# Patient Record
Sex: Female | Born: 1990 | Hispanic: Yes | Marital: Single | Smoking: Never smoker
Health system: Southern US, Community
[De-identification: ages and names within clinical notes are randomized; demographics above are authoritative.]

## PROBLEM LIST (undated history)

## (undated) DIAGNOSIS — J45909 Unspecified asthma, uncomplicated: Secondary | ICD-10-CM

## (undated) DIAGNOSIS — O24419 Gestational diabetes mellitus in pregnancy, unspecified control: Secondary | ICD-10-CM

## (undated) HISTORY — DX: Morbid (severe) obesity due to excess calories: E66.01

## (undated) HISTORY — PX: CHOLECYSTECTOMY: SHX55

---

## 2013-01-21 LAB — HEPATITIS B SURFACE ANTIGEN W/ REFLEX TO CONFIRMATION: Hepatitis B Surface Antigen: NEGATIVE

## 2013-01-21 LAB — RPR: RPR: NONREACTIVE

## 2013-01-21 LAB — GONOCOCCUS CULTURE
Chlamydia trachomatis Culture: NEGATIVE
Culture Gonorrhoeae: NEGATIVE

## 2013-01-21 LAB — RUBELLA ANTIBODY, IGG: Rubella AB, IgG: IMMUNE

## 2013-01-21 LAB — HIV AG/AB 4TH GENERATION: HIV Ag/Ab, 4th Generation: NEGATIVE

## 2013-03-21 LAB — GROUP B STREP TRANSCRIBED: GBS Transcribed: NEGATIVE

## 2013-03-31 ENCOUNTER — Inpatient Hospital Stay: Payer: Medicaid Other | Admitting: Obstetrics & Gynecology

## 2013-03-31 ENCOUNTER — Inpatient Hospital Stay
Admission: AD | Admit: 2013-03-31 | Discharge: 2013-04-04 | DRG: 540 | Disposition: A | Discharge status: Home / Self Care | Payer: No Typology Code available for payment source | Source: Ambulatory Visit | Attending: Obstetrics & Gynecology | Admitting: Obstetrics & Gynecology

## 2013-03-31 DIAGNOSIS — O368990 Maternal care for other specified fetal problems, unspecified trimester, not applicable or unspecified: Secondary | ICD-10-CM | POA: Diagnosis present

## 2013-03-31 DIAGNOSIS — O99892 Other specified diseases and conditions complicating childbirth: Principal | ICD-10-CM | POA: Diagnosis present

## 2013-03-31 DIAGNOSIS — Z3A37 37 weeks gestation of pregnancy: Secondary | ICD-10-CM

## 2013-03-31 DIAGNOSIS — O43899 Other placental disorders, unspecified trimester: Secondary | ICD-10-CM | POA: Diagnosis present

## 2013-03-31 DIAGNOSIS — O329XX0 Maternal care for malpresentation of fetus, unspecified, not applicable or unspecified: Secondary | ICD-10-CM | POA: Diagnosis present

## 2013-03-31 DIAGNOSIS — J45909 Unspecified asthma, uncomplicated: Secondary | ICD-10-CM | POA: Diagnosis present

## 2013-03-31 HISTORY — DX: Unspecified asthma, uncomplicated: J45.909

## 2013-03-31 LAB — CBC AND DIFFERENTIAL
Basophils Absolute Automated: 0.02 (ref 0.00–0.20)
Basophils Automated: 0 %
Eosinophils Absolute Automated: 0.05 (ref 0.00–0.70)
Eosinophils Automated: 0 %
Hematocrit: 33.7 % — ABNORMAL LOW (ref 37.0–47.0)
Hgb: 11.3 g/dL — ABNORMAL LOW (ref 12.0–16.0)
Immature Granulocytes Absolute: 0.03
Immature Granulocytes: 0 %
Lymphocytes Absolute Automated: 3.65 (ref 0.50–4.40)
Lymphocytes Automated: 27 %
MCH: 28.3 pg (ref 28.0–32.0)
MCHC: 33.5 g/dL (ref 32.0–36.0)
MCV: 84.5 fL (ref 80.0–100.0)
MPV: 11.9 fL (ref 9.4–12.3)
Monocytes Absolute Automated: 0.65 (ref 0.00–1.20)
Monocytes: 5 %
Neutrophils Absolute: 8.95 — ABNORMAL HIGH (ref 1.80–8.10)
Neutrophils: 67 %
Nucleated RBC: 0 (ref 0–1)
Platelets: 194 (ref 140–400)
RBC: 3.99 — ABNORMAL LOW (ref 4.20–5.40)
RDW: 14 % (ref 12–15)
WBC: 13.32 — ABNORMAL HIGH (ref 3.50–10.80)

## 2013-03-31 LAB — TYPE AND SCREEN
AB Screen Gel: NEGATIVE
ABO Rh: O POS

## 2013-03-31 MED ORDER — OXYTOCIN 30 UNITS IN LR 500 ML LABOR -OUTSOURCED
2.0000 m[IU]/min | INTRAVENOUS | Status: DC
Start: 2013-03-31 — End: 2013-04-01
  Administered 2013-04-01: 7.5 m[IU]/min via INTRAVENOUS
  Administered 2013-04-01: 2 m[IU]/min via INTRAVENOUS
  Filled 2013-03-31: qty 500

## 2013-03-31 MED ORDER — LACTATED RINGERS IV SOLN
INTRAVENOUS | Status: DC
Start: 2013-03-31 — End: 2013-04-01
  Administered 2013-04-01 (×3): 1000 mL via INTRAVENOUS

## 2013-03-31 MED ORDER — ONDANSETRON HCL 4 MG/2ML IJ SOLN
8.0000 mg | Freq: Three times a day (TID) | INTRAMUSCULAR | Status: DC | PRN
Start: 2013-03-31 — End: 2013-04-01

## 2013-03-31 NOTE — H&P (Signed)
Subjective:   Dana Norman is a 22 y.o. G1 P0 female with [facility] 04/18/13 at 77 and 0/[redacted] weeks gestation who is being admitted for labor management.  Her current obstetrical history is significant for being uncomplicated according to pt.  EMR not available from office..  Patient reports contractions since this morning, mild and bloody show..   Fetal Movement: normal.   Past Medical History   Diagnosis Date   . Asthma without status asthmaticus      albuterol inhaler   .        Objective:     Vital signs in last 24 hours:  Heart Rate:  [94] 94   BP: (124)/(66) 124/66 mmHg      General:   alert, appears stated age, cooperative and no distress   Lungs:   clear to auscultation bilaterally   Heart:   regular rate and rhythm, S1, S2 normal, no murmur, click, rub or gallop   Abdomen:  soft, non-tender; bowel sounds normal; no masses,  no organomegaly and EFW c/w 7-8 lbs   Pelvis:  Exam deferred.   FHT:  145 Category I   Presentations: cephalic   Cervix:     Dilation: 3cm    Effacement: 75%    Station:  -1    Consistency: soft    Position: posterior     Lab Review labs unknown due to lack of prenatal record.  Pt states GBS negative     Assessment/Plan:   38 and 0/[redacted] weeks gestation.  Early latent labor.  Obstetrical history significant for nothing .       Risks, benefits, alternatives and possible complications have been discussed in detail with the patient.  Pre-admission, admission, and post admission procedures and expectations were discussed in detail.  All questions answered, all appropriate consents will be signed at the Hospital. Admission is planned for today.   Ambulate, unmonitored., Expectant management. and Anticipate vaginal delivery.

## 2013-03-31 NOTE — Progress Notes (Signed)
Pt to LDR 2. Report to Maurine Cane, RN.

## 2013-03-31 NOTE — Progress Notes (Signed)
22yo g1p0 at 37.3 weeks to triage states last night she had a "glob of bloody mucous" and this morning also had some spots of pink tinged discharge. Pt reports mild ctx and denies leaking fluid. Positive fetal movement. EFM and toco applied.

## 2013-03-31 NOTE — Progress Notes (Signed)
Pt off monitors to walk.

## 2013-04-01 ENCOUNTER — Inpatient Hospital Stay: Payer: No Typology Code available for payment source | Admitting: Certified Registered"

## 2013-04-01 ENCOUNTER — Encounter: Payer: Self-pay | Admitting: Certified Registered"

## 2013-04-01 ENCOUNTER — Encounter
Admission: AD | Disposition: A | Discharge status: Home / Self Care | Payer: Self-pay | Source: Ambulatory Visit | Attending: Obstetrics & Gynecology

## 2013-04-01 DIAGNOSIS — Z3A37 37 weeks gestation of pregnancy: Secondary | ICD-10-CM

## 2013-04-01 SURGERY — Surgical Case
Anesthesia: Regional | Site: Abdomen | Wound class: Clean Contaminated

## 2013-04-01 MED ORDER — LACTATED RINGERS IV BOLUS
1000.0000 mL | Freq: Once | INTRAVENOUS | Status: DC
Start: 2013-04-01 — End: 2013-04-01

## 2013-04-01 MED ORDER — NALOXONE HCL 0.4 MG/ML IJ SOLN
0.1000 mg | INTRAMUSCULAR | Status: DC | PRN
Start: 2013-04-01 — End: 2013-04-04

## 2013-04-01 MED ORDER — MAGNESIUM HYDROXIDE 400 MG/5ML PO SUSP
30.0000 mL | Freq: Four times a day (QID) | ORAL | Status: DC | PRN
Start: 2013-04-01 — End: 2013-04-04

## 2013-04-01 MED ORDER — LANOLIN EX OINT
TOPICAL_OINTMENT | CUTANEOUS | Status: DC | PRN
Start: 2013-04-01 — End: 2013-04-04

## 2013-04-01 MED ORDER — OXYCODONE-ACETAMINOPHEN 5-325 MG PO TABS
2.0000 | ORAL_TABLET | ORAL | Status: DC | PRN
Start: 2013-04-01 — End: 2013-04-04
  Administered 2013-04-03: 1 via ORAL
  Administered 2013-04-03: 2 via ORAL
  Administered 2013-04-03 (×3): 1 via ORAL
  Administered 2013-04-04: 2 via ORAL
  Filled 2013-04-01: qty 1
  Filled 2013-04-01: qty 2
  Filled 2013-04-01: qty 1
  Filled 2013-04-01: qty 2
  Filled 2013-04-01: qty 1
  Filled 2013-04-01: qty 2

## 2013-04-01 MED ORDER — MORPHINE SULFATE (PF) 1 MG/ML IJ SOLN
INTRAMUSCULAR | Status: DC | PRN
Start: 2013-04-01 — End: 2013-04-01
  Administered 2013-04-01: 3 mg via EPIDURAL

## 2013-04-01 MED ORDER — BENZOCAINE-MENTHOL 20-0.5 % EX AERO
1.0000 | INHALATION_SPRAY | CUTANEOUS | Status: DC | PRN
Start: 2013-04-01 — End: 2013-04-04

## 2013-04-01 MED ORDER — METOCLOPRAMIDE HCL 5 MG/ML IJ SOLN
10.0000 mg | Freq: Once | INTRAMUSCULAR | Status: DC | PRN
Start: 2013-04-01 — End: 2013-04-01

## 2013-04-01 MED ORDER — HYDROMORPHONE HCL PF 1 MG/ML IJ SOLN
0.2000 mg | INTRAMUSCULAR | Status: DC | PRN
Start: 2013-04-01 — End: 2013-04-04

## 2013-04-01 MED ORDER — ONDANSETRON HCL 4 MG/2ML IJ SOLN
4.0000 mg | Freq: Once | INTRAMUSCULAR | Status: DC | PRN
Start: 2013-04-01 — End: 2013-04-01

## 2013-04-01 MED ORDER — MEPERIDINE HCL 25 MG/ML IJ SOLN
12.5000 mg | INTRAMUSCULAR | Status: DC | PRN
Start: 2013-04-01 — End: 2013-04-01

## 2013-04-01 MED ORDER — SODIUM CHLORIDE 0.9 % IJ SOLN
INTRAMUSCULAR | Status: AC
Start: 2013-04-01 — End: ?
  Filled 2013-04-01: qty 10

## 2013-04-01 MED ORDER — METOCLOPRAMIDE HCL 5 MG/ML IJ SOLN
INTRAMUSCULAR | Status: AC
Start: 2013-04-01 — End: ?
  Filled 2013-04-01: qty 2

## 2013-04-01 MED ORDER — MORPHINE SULFATE (PF) 1 MG/ML IJ SOLN
INTRAMUSCULAR | Status: AC
Start: 2013-04-01 — End: ?
  Filled 2013-04-01: qty 10

## 2013-04-01 MED ORDER — SIMETHICONE 80 MG PO CHEW
160.0000 mg | CHEWABLE_TABLET | Freq: Three times a day (TID) | ORAL | Status: DC | PRN
Start: 2013-04-01 — End: 2013-04-04

## 2013-04-01 MED ORDER — LACTATED RINGERS IV SOLN
INTRAVENOUS | Status: DC | PRN
Start: 2013-04-01 — End: 2013-04-01

## 2013-04-01 MED ORDER — ONDANSETRON HCL 4 MG PO TABS
4.0000 mg | ORAL_TABLET | Freq: Three times a day (TID) | ORAL | Status: DC | PRN
Start: 2013-04-01 — End: 2013-04-04

## 2013-04-01 MED ORDER — OXYTOCIN 30UNITS IN LR 500 ML PP--OUTSOURCED
7.5000 [IU]/h | INTRAVENOUS | Status: AC
Start: 2013-04-01 — End: 2013-04-02

## 2013-04-01 MED ORDER — NALBUPHINE HCL 10 MG/ML IJ SOLN
10.0000 mg | INTRAMUSCULAR | Status: DC | PRN
Start: 2013-04-01 — End: 2013-04-01

## 2013-04-01 MED ORDER — FENTANYL CITRATE 0.05 MG/ML IJ SOLN
INTRAMUSCULAR | Status: DC | PRN
Start: 2013-04-01 — End: 2013-04-01
  Administered 2013-04-01: 100 ug via EPIDURAL

## 2013-04-01 MED ORDER — HYDROMORPHONE HCL PF 1 MG/ML IJ SOLN
0.5000 mg | INTRAMUSCULAR | Status: DC | PRN
Start: 2013-04-01 — End: 2013-04-01

## 2013-04-01 MED ORDER — WITCH HAZEL-GLYCERIN EX PADS
1.0000 | MEDICATED_PAD | CUTANEOUS | Status: DC | PRN
Start: 2013-04-01 — End: 2013-04-04

## 2013-04-01 MED ORDER — ONDANSETRON HCL 4 MG/2ML IJ SOLN
4.0000 mg | Freq: Once | INTRAMUSCULAR | Status: DC | PRN
Start: 2013-04-01 — End: 2013-04-04

## 2013-04-01 MED ORDER — CEFAZOLIN SODIUM 1 G IJ SOLR
1.0000 g | Freq: Once | INTRAMUSCULAR | Status: DC
Start: 2013-04-01 — End: 2013-04-01

## 2013-04-01 MED ORDER — METHYLERGONOVINE MALEATE 0.2 MG/ML IJ SOLN
INTRAMUSCULAR | Status: AC
Start: 2013-04-01 — End: 2013-04-02
  Filled 2013-04-01: qty 1

## 2013-04-01 MED ORDER — CARBOPROST TROMETHAMINE 250 MCG/ML IM SOLN
250.0000 ug | Freq: Once | INTRAMUSCULAR | Status: DC | PRN
Start: 2013-04-01 — End: 2013-04-01

## 2013-04-01 MED ORDER — KETOROLAC TROMETHAMINE 30 MG/ML IJ SOLN
INTRAMUSCULAR | Status: AC
Start: 2013-04-01 — End: ?
  Filled 2013-04-01: qty 1

## 2013-04-01 MED ORDER — NALBUPHINE HCL 10 MG/ML IJ SOLN
10.0000 mg | INTRAMUSCULAR | Status: DC | PRN
Start: 2013-04-01 — End: 2013-04-04

## 2013-04-01 MED ORDER — ONDANSETRON HCL 4 MG/2ML IJ SOLN
INTRAMUSCULAR | Status: DC | PRN
Start: 2013-04-01 — End: 2013-04-01
  Administered 2013-04-01: 4 mg via INTRAVENOUS

## 2013-04-01 MED ORDER — HYDROCORTISONE 1 % EX OINT
TOPICAL_OINTMENT | Freq: Three times a day (TID) | CUTANEOUS | Status: DC | PRN
Start: 2013-04-01 — End: 2013-04-04

## 2013-04-01 MED ORDER — SENNOSIDES-DOCUSATE SODIUM 8.6-50 MG PO TABS
1.0000 | ORAL_TABLET | Freq: Two times a day (BID) | ORAL | Status: DC | PRN
Start: 2013-04-01 — End: 2013-04-04
  Administered 2013-04-03: 1 via ORAL
  Filled 2013-04-01: qty 1

## 2013-04-01 MED ORDER — CEFAZOLIN SODIUM-DEXTROSE 2-3 GM-% IV SOLR
2.0000 g | Freq: Once | INTRAVENOUS | Status: DC
Start: 2013-04-01 — End: 2013-04-01

## 2013-04-01 MED ORDER — CITRIC ACID-SODIUM CITRATE 334-500 MG/5ML PO SOLN
30.0000 mL | Freq: Once | ORAL | Status: DC | PRN
Start: 2013-04-01 — End: 2013-04-01

## 2013-04-01 MED ORDER — LACTATED RINGERS IV SOLN
INTRAVENOUS | Status: DC
Start: 2013-04-01 — End: 2013-04-01

## 2013-04-01 MED ORDER — PHENYLEPHRINE 100 MCG/ML IV BOLUS (ANESTHESIA)
PREFILLED_SYRINGE | INTRAVENOUS | Status: DC | PRN
Start: 2013-04-01 — End: 2013-04-01
  Administered 2013-04-01 (×4): 200 ug via INTRAVENOUS

## 2013-04-01 MED ORDER — PRENATAL AD PO TABS
1.0000 | ORAL_TABLET | Freq: Every day | ORAL | Status: DC
Start: 2013-04-01 — End: 2013-04-04
  Administered 2013-04-03 – 2013-04-04 (×2): 1 via ORAL
  Filled 2013-04-01 (×2): qty 1

## 2013-04-01 MED ORDER — MISOPROSTOL 200 MCG PO TABS
800.0000 ug | ORAL_TABLET | Freq: Once | ORAL | Status: DC | PRN
Start: 2013-04-01 — End: 2013-04-01

## 2013-04-01 MED ORDER — OXYCODONE-ACETAMINOPHEN 5-325 MG PO TABS
2.0000 | ORAL_TABLET | ORAL | Status: DC | PRN
Start: 2013-04-01 — End: 2013-04-04

## 2013-04-01 MED ORDER — DIPHENHYDRAMINE HCL 50 MG/ML IJ SOLN
6.2500 mg | INTRAMUSCULAR | Status: DC | PRN
Start: 2013-04-01 — End: 2013-04-04

## 2013-04-01 MED ORDER — ONDANSETRON HCL 4 MG/2ML IJ SOLN
4.0000 mg | Freq: Three times a day (TID) | INTRAMUSCULAR | Status: DC | PRN
Start: 2013-04-01 — End: 2013-04-04

## 2013-04-01 MED ORDER — CEFAZOLIN SODIUM-DEXTROSE 2-3 GM-% IV SOLR
INTRAVENOUS | Status: AC
Start: 2013-04-01 — End: 2013-04-02
  Filled 2013-04-01: qty 50

## 2013-04-01 MED ORDER — METHYLERGONOVINE MALEATE 0.2 MG PO TABS
0.2000 mg | ORAL_TABLET | Freq: Four times a day (QID) | ORAL | Status: AC | PRN
Start: 2013-04-01 — End: 2013-04-02

## 2013-04-01 MED ORDER — DEXAMETHASONE SODIUM PHOSPHATE 4 MG/ML IJ SOLN (WRAP)
INTRAMUSCULAR | Status: DC | PRN
Start: 2013-04-01 — End: 2013-04-01
  Administered 2013-04-01: 4 mg via INTRAVENOUS

## 2013-04-01 MED ORDER — BISACODYL 10 MG RE SUPP
10.0000 mg | Freq: Every day | RECTAL | Status: DC | PRN
Start: 2013-04-01 — End: 2013-04-04

## 2013-04-01 MED ORDER — MEASLES, MUMPS & RUBELLA VAC SC INJ
0.5000 mL | INJECTION | SUBCUTANEOUS | Status: DC | PRN
Start: 2013-04-01 — End: 2013-04-04

## 2013-04-01 MED ORDER — FENTANYL 2 MCG/ML+BUPIVACAINE 0.125% 100 ML
EPIDURAL | Status: DC | PRN
Start: 2013-04-01 — End: 2013-04-01
  Administered 2013-04-01: 10 mL/h via EPIDURAL

## 2013-04-01 MED ORDER — METHYLERGONOVINE MALEATE 0.2 MG/ML IJ SOLN
200.0000 ug | Freq: Once | INTRAMUSCULAR | Status: DC | PRN
Start: 2013-04-01 — End: 2013-04-01

## 2013-04-01 MED ORDER — PROMETHAZINE HCL 25 MG/ML IJ SOLN
6.2500 mg | Freq: Once | INTRAMUSCULAR | Status: DC | PRN
Start: 2013-04-01 — End: 2013-04-01

## 2013-04-01 MED ORDER — BUPIVACAINE HCL (PF) 0.25 % IJ SOLN
INTRAMUSCULAR | Status: DC | PRN
Start: 2013-04-01 — End: 2013-04-01
  Administered 2013-04-01: 4 mL via EPIDURAL
  Administered 2013-04-01: 3 mL via EPIDURAL

## 2013-04-01 MED ORDER — KETOROLAC TROMETHAMINE 30 MG/ML IJ SOLN
INTRAMUSCULAR | Status: DC | PRN
Start: 2013-04-01 — End: 2013-04-01
  Administered 2013-04-01: 30 mg via INTRAVENOUS

## 2013-04-01 MED ORDER — KETOROLAC TROMETHAMINE 30 MG/ML IJ SOLN
30.0000 mg | Freq: Four times a day (QID) | INTRAMUSCULAR | Status: DC | PRN
Start: 2013-04-01 — End: 2013-04-04
  Administered 2013-04-02 (×3): 30 mg via INTRAVENOUS
  Filled 2013-04-01 (×3): qty 1

## 2013-04-01 MED ORDER — METHYLERGONOVINE MALEATE 0.2 MG/ML IJ SOLN
INTRAMUSCULAR | Status: DC | PRN
Start: 2013-04-01 — End: 2013-04-01
  Administered 2013-04-01: .2 mg via INTRAMUSCULAR

## 2013-04-01 MED ORDER — FENTANYL CITRATE 0.05 MG/ML IJ SOLN
INTRAMUSCULAR | Status: AC
Start: 2013-04-01 — End: ?
  Filled 2013-04-01: qty 2

## 2013-04-01 MED ORDER — MISOPROSTOL 200 MCG PO TABS
800.0000 ug | ORAL_TABLET | Freq: Once | ORAL | Status: DC | PRN
Start: 2013-04-01 — End: 2013-04-04

## 2013-04-01 MED ORDER — FENTANYL 2 MCG/ML+BUPIVACAINE 0.125% 100 ML
10.0000 mL/h | EPIDURAL | Status: DC
Start: 2013-04-01 — End: 2013-04-01

## 2013-04-01 MED ORDER — PROMETHAZINE HCL 25 MG/ML IJ SOLN
6.2500 mg | INTRAMUSCULAR | Status: DC | PRN
Start: 2013-04-01 — End: 2013-04-04

## 2013-04-01 MED ORDER — LIDOCAINE-EPINEPHRINE 2 %-1:200000 IJ SOLN
INTRAMUSCULAR | Status: DC | PRN
Start: 2013-04-01 — End: 2013-04-01
  Administered 2013-04-01: 1 mg
  Administered 2013-04-01: 2 mg
  Administered 2013-04-01: 3 mg via EPIDURAL
  Administered 2013-04-01: 2 mg
  Administered 2013-04-01: 7 mg

## 2013-04-01 MED ORDER — OXYTOCIN 10 UNIT/ML IJ SOLN
INTRAMUSCULAR | Status: DC | PRN
Start: 2013-04-01 — End: 2013-04-01
  Administered 2013-04-01: 15 [IU] via INTRAVENOUS

## 2013-04-01 MED ORDER — IBUPROFEN 600 MG PO TABS
600.0000 mg | ORAL_TABLET | Freq: Four times a day (QID) | ORAL | Status: DC | PRN
Start: 2013-04-01 — End: 2013-04-04
  Administered 2013-04-03 – 2013-04-04 (×5): 600 mg via ORAL
  Filled 2013-04-01 (×6): qty 1

## 2013-04-01 MED ORDER — ONDANSETRON HCL 4 MG/2ML IJ SOLN
INTRAMUSCULAR | Status: AC
Start: 2013-04-01 — End: ?
  Filled 2013-04-01: qty 2

## 2013-04-01 MED ORDER — DEXAMETHASONE SODIUM PHOSPHATE 4 MG/ML IJ SOLN
INTRAMUSCULAR | Status: AC
Start: 2013-04-01 — End: ?
  Filled 2013-04-01: qty 1

## 2013-04-01 MED ORDER — FAMOTIDINE 10 MG/ML IV SOLN (WRAP)
20.0000 mg | Freq: Once | INTRAVENOUS | Status: DC | PRN
Start: 2013-04-01 — End: 2013-04-01

## 2013-04-01 MED ORDER — METOCLOPRAMIDE HCL 5 MG/ML IJ SOLN
INTRAMUSCULAR | Status: DC | PRN
Start: 2013-04-01 — End: 2013-04-01
  Administered 2013-04-01: 10 mg via INTRAVENOUS

## 2013-04-01 MED ORDER — FENTANYL CITRATE 0.05 MG/ML IJ SOLN
50.0000 ug | INTRAMUSCULAR | Status: DC | PRN
Start: 2013-04-01 — End: 2013-04-01

## 2013-04-01 MED ORDER — NALOXONE HCL 0.4 MG/ML IJ SOLN
0.1000 mg | INTRAMUSCULAR | Status: DC | PRN
Start: 2013-04-01 — End: 2013-04-01

## 2013-04-01 SURGICAL SUPPLY — 37 items
ADHESIVE TISSUE INDERMIL (Skin Closure) IMPLANT
BARD FOLEY CATH TRAY  W/SL 16F (Catheter Urine) ×2 IMPLANT
BLANKET LOWER BODY ALLIANCE (Patient Supply) ×2 IMPLANT
DRESSING ISLAND TELFA 4X10 (Dressing) ×2 IMPLANT
GLOVE SRG 6.5 BGL M LTX STRL PF TXTR (Glove) ×1
GLOVE SURGICAL 6 1/2 BIOGEL M POWDER (Glove) ×1
GLOVE SURGICAL 6 1/2 BIOGEL M POWDER FREE TEXTURE BEAD CUFF (Glove) ×1 IMPLANT
INSTRUMENT SUCTION NON VENTED LARGE EYED SHEATH POOLE 0035040 (Tubes) IMPLANT
MARKER SKIN (Positioning Supplies) IMPLANT
PROTECTOR TISS MED ALEXIS LF FLXB RTRCT (Retractor) ×1
PROTECTOR TISS XL ALEXIS LF FLXB RTRCT (Retractor)
PROTECTOR TISSUE MEDIUM FLEXBLE (Retractor) ×1
PROTECTOR TISSUE MEDIUM FLEXIBLE (Retractor) ×1
PROTECTOR TISSUE XL FLEXIBLE RETRACTION (Retractor)
PROTECTOR TISSUE XL FLEXIBLE RETRACTION RING ATRAUMATIC SELF RETAIN (Retractor) IMPLANT
RETRACTOR TISSUE MEDIUM FLEXIBLE RETRACTION RING ATRAUMATIC SELF (Retractor) ×1 IMPLANT
SLEEVE CMPR MED KN LGTH KDL SCD 21- IN (Sleeve) ×2
SLEEVE COMPRESSION MEDIUM KNEE LENGTH KENDALL SEQUENTIAL OD21- IN (Sleeve) ×1 IMPLANT
SOL NACL .9% 10ML VIAL FLIPTO (IV Solutions) IMPLANT
SPONGE LAP 18X18IN PREWASH WHT (Sponge)
SPONGE LAPAROTOMY L18 IN X W18 IN (Sponge)
SPONGE LAPAROTOMY L18 IN X W18 IN PREWASH WHITE (Sponge) IMPLANT
STRIP SKIN CLOSURE L4 IN X W1/2 IN (Dressing)
STRIP SKIN CLOSURE L4 IN X W1/2 IN REINFORCE STERI-STRIP POLYESTER (Dressing) IMPLANT
STRIP SKNCLS PLSTR STRSTRP 4X.5IN LF (Dressing)
SUTURE CHROMIC 1 CT 36IN (Suture) ×2 IMPLANT
SUTURE CHROMIC 2-0 CT1 27IN (Suture) ×4 IMPLANT
SUTURE PLAIN 3-0 CT1 27IN (Suture) ×2 IMPLANT
SUTURE VICRYL 0-0 CT1 (Suture) ×6 IMPLANT
SUTURE VICRYL 4-0 PS2 27IN (Suture) IMPLANT
SYRINGE CATH TIP 50ML LF STRL GRAD (Syringes, Needles)
SYRINGE CATHETER TIP GRADUATED (Syringes, Needles)
SYRINGE CATHETER TIP GRADUATED NONPYROGENIC DEHP FREE PVC FREE 50 ML (Syringes, Needles) IMPLANT
TRAY C-BIRTH (Pack) ×2 IMPLANT
TRAY CATH FOLEY 16F (Catheter Micellaneous)
TRAY CATH FOLEY 16F (Catheter Miscellaneous) IMPLANT
TUBING POOLE SUCTION DEVICE (Tubes)

## 2013-04-01 NOTE — Progress Notes (Signed)
Report off to Karen Peterson RN

## 2013-04-01 NOTE — Progress Notes (Signed)
Received report from Donzetta Kohut RN assumed care of pt.

## 2013-04-01 NOTE — Progress Notes (Signed)
UPDATE    Patient has now been pushing 45 minutes and has made no change in station.  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  We have tried lying patient on left lateral decubitus position without rotation.  Baby did not tolerate right lateral decubitus at all with prolonged deceleration.  I have counseled patient and will proceed with cesarean.

## 2013-04-01 NOTE — Progress Notes (Signed)
Off to OR via bed for primary C/S

## 2013-04-01 NOTE — Progress Notes (Signed)
Has had regular but mild contractions all night. Becoming stronger.  VE:  4-5 cms 75% effaced, -2 station but head not ballotable.  Attempted AROM but little fluid obtained. Will start PItocin if contractions don't pick up.

## 2013-04-01 NOTE — H&P (Signed)
Subjective:   Dana Norman is a 22 y.o. G1P0 female at [redacted]w[redacted]d, Estimated Date of Delivery: 04/18/13.  She was admitted at 5:30 PM yesterday for labor management.  Her current obstetrical history is significant for being uncomplicated.  She was managed expectantly overnight and pitocin was begun this morning when she was 4 cm.  Was 5 cm to my exam at 10:00 this morning with fetal head overriding maternal pelvis above pelvic inlet.    Historical Data:  Past Medical History   Diagnosis Date   . Asthma without status asthmaticus      albuterol inhaler     History reviewed. No pertinent past surgical history.  OB History   Gravida Para Term Preterm AB SAB TAB Ectopic Multiple Living   1               # Outcome Date GA Lbr Len/2nd Weight Sex Delivery Anes PTL Lv   1 CUR                   Meds:  Prescriptions prior to admission   Medication Sig Dispense Refill   . Prenatal MV-Min-Fe Fum-FA-DHA (PRENATAL 1 PO) Take by mouth.             All:  NKDA        Vital signs in last 24 hours:  Temp:  [97.8 F (36.6 C)-98.1 F (36.7 C)] 97.8 F (36.6 C)  Heart Rate:  [62-94] 80   Resp Rate:  [20] 20   BP: (0-135)/(0-82) 124/74 mmHg     FHT:  140's Category 1    General:   appears stated age, cooperative   Lungs:   Clear to auscultation bilaterally   Heart:    Regular rate and rhythm, no murmer, no rub, no gallop   Abdomen:   Soft, nonender, normal active bowel sounds, gravid uterus   SVE:  C/^/-2 AROM at 11:L30 this morning for clear fluid     Lab Review:  GBS negative  Lab Results   Component Value Date    ABORH O POS 03/31/2013        Assessment:   22 y.o. G1P0 female at [redacted]w[redacted]d   Active labor   Pitocin augmentation   Malpresentation   Has epidural   GBS negative    Plan:   Risks, benefits, alternatives and possible complications have been discussed in detail with the patient.  All questions answered.    Continues to labor with presentation improved.   Continue pitocin augmentation

## 2013-04-01 NOTE — Progress Notes (Signed)
MOM     22 y.o.  G1P1001  [redacted]w[redacted]d weeks  Admitted for labor  Prior Delivery none   Her current obstetrical history is significant for     No Known Allergies    Delivering Clinician:     Timoteo Ace ARNOLD   Delivery Type:     Cesarean     Anesthesia:     Epidural   On-Q Pump no    Past Medical History   Diagnosis Date   . Asthma without status asthmaticus      albuterol inhaler         Rupture time - 04/01/2013  1125  Fluid color - clear    Laceration (Yes/None):     None   Laceration Type:       Episiotomy:     None       Pertinent prenatal Labs -   Lab Results   Component Value Date    ABORH O POS 03/31/2013    HEPBSAG Negative 01/21/2013    GBS Negative 03/21/2013    RUBELLAABIGG Immune 01/21/2013       Fundus:1/u  Lochia:small    Vaccine Screening:  Tdap:         Wants    Given     Declined        Flu:             Wants    Given     Declined             Pneumo:    Wants    Given     Declined        Rhogam:    Wants    Given     Declined    MR:             Wants   Given     Declined        Summary of Events-      ADDITIONAL COMMENTS:    Scheduled Meds______________________________________    PRN Meds____________________________________________    Medications given in L&D-   Methergine 0.2 mg IM in OR, 1817 Toradol and Zofran     Bladder status- Foley    IV Infusing--LR and 2nd bag of Pit    EBL  1000    Classes:  Breast feedng Class        Discharge Class          BABY      Cesarean  of live female  infant 6 lb 5.6 oz (2880 g)      1 Minute Apgar:     8      5 Minute Apgar:     9     ADDITIONAL COMMENTS:  Skin-to-skin initiated, breast fed @ 1900       for  15 mins  Formula  none            Labs due-   Cord blood sent sent  Eyes & Thighs given at 1747    Void Occurrence- no  Stool Occurrence- no  Bath:  Hep B:    Blood Type __________  TCB ___________@___________     Does NOT desire circumcision    At home Pediatrician: [facility] Pediatrician:     H&P:  Hearing:   CCHD:   Circ:   CID:   PKU:   CSC:    *Pt  transferred to [facility]. Bedside report given to [facility] RN using ISHAPED report. Bands checked and verified at bedside  with [facility] personnel. Safety checks done. Pt oriented to [facility] room, all questions answered. Pt care relinquished.    Toula Moos  04/01/2013 9:15 PM

## 2013-04-01 NOTE — Anesthesia Preprocedure Evaluation (Addendum)
Anesthesia Evaluation    AIRWAY    Mallampati: III    TM distance: >3 FB  Neck ROM: full  Mouth Opening:full   CARDIOVASCULAR    cardiovascular exam normal       DENTAL    No notable dental hx     PULMONARY    pulmonary exam normal     OTHER FINDINGS    Risks and benefits and alternatives discussed with patient. Questions answered and patient demonstrates understanding and agrees with anesthetic plan.                   Anesthesia Plan    ASA 2     epidural                                 informed consent obtained      pertinent labs reviewed

## 2013-04-01 NOTE — Plan of Care (Signed)
Resumed pt care

## 2013-04-01 NOTE — Plan of Care (Signed)
Brooke Dare RN assummed pt care

## 2013-04-01 NOTE — Progress Notes (Signed)
Dr. Dareen Piano talking to pt and FOB about C/S since distress occurs with ev. Push without progress

## 2013-04-01 NOTE — Anesthesia Postprocedure Evaluation (Signed)
Pt is doing well.  No post partum anesthesia issues.  Pt is ambulating well. No post-dural puncture headache. No residual weakness.     Dana Geathers C Filipescu-Turner, MD

## 2013-04-01 NOTE — Plan of Care (Signed)
Problem: Pain/Discomfort  Goal: Pain/discomfort is manageable.  Patient states pain is 2/10 and she is comfortable with that. Pain management plan discussed with patient. Will continue to assess pain and provide pain management as needed.

## 2013-04-01 NOTE — Op Note (Signed)
Patient: Dana Norman, Dana Norman   MRN: [id number]  Document ID:   Procedure Date: 04/01/2013      Pre-operative Diagnosis: Pregnancy at 37 4/7 weeks, active labor, fetal intolerance of labor  Post-operative Diagnosis: same, delivered a live boy LOP malpresentation     Procedure: Primary Low Transverse Cesarean section    Anesthesia: Spinal     Surgeon: Daine Gip     Assistant: Elvina Sidle    Drains:  Foley    Estimated Blood Loss:  1000 cc    Fluids:  Crystalloid        Findings: Delivered a live boy infant from LOP  with asynclytism and compound right arm presentation.  Normal tubes and ovaries, placenta  Deep pelvis, pelvic exam consistent with android pelvis.     Infant Wt:      6 lb 5.6 oz (2880 g)      Apgars:  Apgar 1 minute: 8  Apgar 5 minute: 9           Placenta and Cord:          Mechanism: spontaneous with pitocin assist    Presentation:  Please see cesarean intention note.    Procedure:      The risks, benefits, possible complications, treatment options, and expected outcomes were discussed with the patient.  The patient asked appropriate questions which were answered to her satisfaction. The patient concurred with the proposed plan, giving informed consent, and she was taken to the operating room, where adequate spinal anesthesia was administered.  A foley catheter was placed, and the patient was prepped and draped in the dorsal supine position with leftward tilt in the usual sterile fashion. Intravenous antibiotics were administered, sequential compression devices were in place, and a surgical time out was performed per protocol.  A scalpel was used to make the skin incision and this incision was carried down to the level of the fascia, which was incised in the midline and extended bilaterally using Mayo scissors. Kocher clamps were used to grasp the inferior and superior borders of the fascial incision, which was dissected sharply off the rectus muscles with the Mayo scissors. The rectus  muscles were split in the midline. The peritoneum was entered bluntly superiorly. The peritoneum was then extended in a superior-inferior fashion, taking care to avoid the bladder. The Alexis retractor was placed in the abdominopelvic incision to assist in visualization. A bladder flap was then created with pickups and Metzenbaum scissors, and the bladder was mobilized inferiorly without difficulty.The uterine incision was extended bluntly in a cranio-caudal direction.  An amniotomy was performed and clear amniotic fluid was obtained, followed by delivery of the infant onto the maternal abdomen. The infant was bulb suctioned for clear amniotic fluid.  The cord was clamped and cut, and the infant was handed to waiting pediatric team.  The placenta was delivered spontaneously intact with pitocin assist. The uterine cavity was examined and cleaned with moist gauze for any remaining membrane fragments and noted to be normal.  The incision extended into the left uterine artery and this was ligated and made hemostatic. The uterus was then closed in a two layer closure with 0 Vicryl in a continuous interlocking fashion. The second layer was of a horizontal imbricating stitch.  Excellent hemomstasis was noted after several figure of 8 sutures were placed. The adnexa were examined with findings as mentioned above. The uterine incision was again examined and noted to be hemostatic. The Jon Gills was removed from the abdominopelvic incision. The  rectus muscles were reapproximated in the midline with imbricating sutures  of 2-0 chromic. The fascia was closed in a running unlocked fashionwith 0 Vicryl suture beginning at each angle and meeting in the midline. The subcutaneous tissue was reapproximated with interrupted suture of 3-0 plain and the skin was closed with a 4-0 Monocryl in a subcuticular fashion. All instrument, sponge, and needle counts were correct, the fundus was expressed, the bandage placed, and the patient and  infant were transferred to the recovery room in stable condition.     Timoteo Ace, M.D., F.A.C.O.G.  [phone]

## 2013-04-01 NOTE — Progress Notes (Signed)
Dr. Dareen Piano here to observe pushing,  Pt to left side [phone] to begin pushing on left side

## 2013-04-01 NOTE — Progress Notes (Signed)
This note also relates to the following rows which could not be included:  Heart Rate - Cannot attach notes to unvalidated device data  BP - Cannot attach notes to unvalidated device data      1125 am as the official time for AROM as per Dr Dareen Piano

## 2013-04-01 NOTE — Anesthesia Procedure Notes (Addendum)
Epidural    Patient location during procedure: L&D  Reason for block: maternal indication  Start time: 04/01/2013 10:50 AM  End time: 04/01/2013 11:05 AM  Staffing  Anesthesiologist: Wyona Almas  CRNA/Resident: MONTGOMERY, Terri Malerba RYAN  Performed by: resident/CRNA Preanesthetic Checklist Completed: patient identified, surgical consent, pre-op evaluation, timeout performed, risks and benefits discussed, monitors and equipment checked and correct site      Epidural    Patient monitoring: Pulse oximetry and NIBP  Premedication: No    Patient position: sitting  Sterile Technique: ChloraPrep  Skin Local: lidocaine 1%      Interspace: L3-4  Number of attempts: 2    Approach: midline  Needle type: Touhy needle     Needle gauge: 17    Epidural Space ID: 7 cm  CSF Return: No   Blood Return: No  Paresthesia Pain: no  Needle type: Touhy needle     Needle gauge: 17  CSF Return: No  Blood Return: No          Paresthesia Pain: no    Catheter type: side hole    Catheter size: 19 G    Catheter at skin depth: 16 cm  CSF Return: No  Blood Return: No  Test Dose:1.5 % lidocaine and negative3 cc  Incremental injection: yes  Pressure Paresthesia: no  No Catheter IV/SA Signs or SymptomsAssessment   Sensory level: Bilateral and T10  Block Outcome: successful block, patient tolerated procedure well and no complications  Additional Notes  First attempt unable to thread catheter.   Block at Surgeon's request: Yes

## 2013-04-02 LAB — CBC AND DIFFERENTIAL
Hematocrit: 30.1 % — ABNORMAL LOW (ref 37.0–47.0)
Hgb: 10 g/dL — ABNORMAL LOW (ref 12.0–16.0)
MCH: 28.2 pg (ref 28.0–32.0)
MCHC: 33.2 g/dL (ref 32.0–36.0)
MCV: 84.8 fL (ref 80.0–100.0)
MPV: 12.4 fL — ABNORMAL HIGH (ref 9.4–12.3)
Platelets: 180 (ref 140–400)
RBC: 3.55 — ABNORMAL LOW (ref 4.20–5.40)
RDW: 14 % (ref 12–15)
WBC: 22.55 — ABNORMAL HIGH (ref 3.50–10.80)

## 2013-04-02 LAB — MAN DIFF ONLY
Band Neutrophils Absolute: 2.71 — ABNORMAL HIGH (ref 0.00–1.00)
Band Neutrophils: 12 %
Basophils Absolute Manual: 0 (ref 0.00–0.20)
Basophils Manual: 0 %
Eosinophils Absolute Manual: 0 (ref 0.00–0.70)
Eosinophils Manual: 0 %
Lymphocytes Absolute Manual: 1.35 (ref 0.50–4.40)
Lymphocytes Manual: 6 %
Monocytes Absolute: 0.9 (ref 0.00–1.20)
Monocytes Manual: 4 %
Neutrophils Absolute Manual: 17.59 — ABNORMAL HIGH (ref 1.80–8.10)
Nucleated RBC: 0 (ref 0–1)
Segmented Neutrophils: 78 %

## 2013-04-02 LAB — CELL MORPHOLOGY
Cell Morphology: NORMAL
Platelet Estimate: NORMAL

## 2013-04-02 MED ORDER — LACTATED RINGERS IV SOLN
INTRAVENOUS | Status: DC
Start: 2013-04-02 — End: 2013-04-04

## 2013-04-02 NOTE — Progress Notes (Signed)
Chart reviewed.  Pt seen.   No apparent anesthestic complications.    Pt s/p single shot intrathecal narcotics.      Mental status: awake and oriented  Respiratory function: stable  No complaints. Pain well controlled.    No significant side effects except mild pruritus.     Pt tolerating PO.  Pt  transitioned to PO pain meds.

## 2013-04-02 NOTE — Plan of Care (Signed)
Problem: Cesarean and Vaginal Delivery-Postpartum Care  Goal: Incision/Perineum will be clean, dry, and intact and without discharge or hematoma.  Outcome: Progressing  Abd incision clean, dry and intact.

## 2013-04-02 NOTE — Progress Notes (Signed)
Room visit, first time mom, delivered less than 24 hrs. Mom states "BF well so far". Educated on BF basics and showed and reinforced BF info in mom's booklet. Enc to continue BF 8-12/day and BF class in am. Enc to call with next feeding for latch check. Phone number on white board.

## 2013-04-02 NOTE — Progress Notes (Signed)
Post Op / Postpartum Note POD #1    Subjective:  Delivery Type: Cesarean Section   No complaints. Pain well controlled. Minimal Bleeding,     Objective:  Vital Signs:  Temp:  [97.2 F (36.2 C)-98.6 F (37 C)] 98.1 F (36.7 C)  Heart Rate:  [55-91] 76   Resp Rate:  [18-21] 18   BP: (0-131)/(0-76) 107/54 mmHg  Breast: soft, non tender   Fundus: firm, non tender   Incision: clean, dry and intact   Lochia: moderate, rubra.  Lower Extremities: trace edema, NT    Recent Labs:    Lab 04/02/13 0530   WBC 22.55*   HGB 10.0*   HCT 30.1*   PLT 180   NEUTROPCT --   LYMPHOPCT --   MONOPCT --   EOSPCT --       Assessment/Plan:  Stable - Postpartum Day 1  Ambulate and advance diet    JTB

## 2013-04-03 ENCOUNTER — Encounter: Payer: Self-pay | Admitting: Obstetrics & Gynecology

## 2013-04-03 NOTE — Progress Notes (Signed)
04/03/13 1400   Lactation Consultation   Visit Type  Follow Up    Mom called, she watched breast feeding video, set up in football positioned,  Baby latched on left breast and dropping and rhythmic sucking with swallows, baby was skin to skin before latching, mom has very pliable, baby sucking well, went over breast compression, sandwich, getting baby on breast deep, check nipple after latch, encouraged breast feeding class in the morning, went over crying and pressure bubbles, no need for formula, continued to breast feed.

## 2013-04-03 NOTE — Plan of Care (Signed)
Problem: Cesarean and Vaginal Delivery-Postpartum Care  Goal: Vital signs and assessments are within defined parameters.  Outcome: Progressing  Assessment and vitals within normal limits

## 2013-04-03 NOTE — Plan of Care (Signed)
Problem: Cesarean and Vaginal Delivery-Postpartum Care  Goal: Ambulates independently.  Outcome: Progressing  Able to ambulate on her own without difficulty.

## 2013-04-03 NOTE — Progress Notes (Signed)
04/03/13 1230   Lactation Consultation   Visit Type  Initial Assessment   Maternal Information   Breastfeeding Status YES   Previous Breastfeeding Experience No   Breast Assessment   Left Breast Large;Round;Pliable   Left Nipple Evert;Short   Right Breast Large;Round;Pliable;Colostrum Present   Right Nipple Evert;Short    Mom feeding supplements and reports baby not latching catching nipple, mom gave some formula, +colostrum, reviewed skin to skin and the importance of baby cueing in, mom to call when baby ready, skin to skin now.

## 2013-04-03 NOTE — Plan of Care (Signed)
Problem: Pain/Discomfort  Goal: Pain/discomfort is manageable.  Be comfortable at all time.

## 2013-04-03 NOTE — Progress Notes (Signed)
Post Op / Postpartum Note    Subjective:  Delivery Type: Cesarean Section   No complaints. Pain well controlled. Minimal Bleeding, Breastfeeding well.    Objective:  Vital Signs:  Temp:  [97 F (36.1 C)-98 F (36.7 C)] 97.7 F (36.5 C)  Heart Rate:  [76-95] 95   Resp Rate:  [16-20] 16   BP: (103-114)/(53-63) 112/53 mmHg  Breast: soft, non tender   Fundus: firm, non tender   Incision: clean, dry and intact   Lochia: moderate, rubra.  Lower Extremities: 1+ bilateral edema, NT    Recent Labs:    Lab 04/02/13 0530   WBC 22.55*   HGB 10.0*   HCT 30.1*   PLT 180   NEUTROPCT --   LYMPHOPCT --   MONOPCT --   EOSPCT --       Assessment/Plan:  Stable - Postpartum Day 2  Routine care, discharge tomorrow, F/U in 6 weeks incision check.  Discharge Meds: PNV, Percocet.

## 2013-04-04 ENCOUNTER — Other Ambulatory Visit: Payer: Self-pay

## 2013-04-04 MED ORDER — OXYCODONE-ACETAMINOPHEN 5-325 MG PO TABS
1.0000 | ORAL_TABLET | ORAL | 0 refills | Status: DC | PRN
Start: 2013-04-04 — End: 2014-05-30
  Filled 2013-04-04: qty 30, 5d supply, fill #0

## 2013-04-04 NOTE — Plan of Care (Signed)
Problem: Cesarean and Vaginal Delivery-Postpartum Care  Goal: Postpartum hemorrhage is prevented or managed promptly  Outcome: Progressing  Fundus is firm and lochia is scant

## 2013-04-04 NOTE — Discharge Summary (Signed)
Obstetrical Discharge Form      Admission Diagnosis: Intrauterine Pregnancy at [redacted]w[redacted]d    Antepartum complications: none    Date of Delivery: 04/01/2013    Time of Delivery: 5:24 PM      Delivered By: Timoteo Ace ARNOLD     Delivery Type: C-section with labor  Tubal Ligation: n/a    Anesthesia: Epidural anesthesia    Baby: Liveborn Female ; Birth weight: 6 lb 5.6 oz (2880 g) ; Apgar 1 minute: 8  Apgar 5 minute: 9 ;                                                       Feeding Type: Breast milk     Delivery complications: none    Episiotomy: None   Laceration Type:      Placenta: 04/01/2013 , 5:25 PM         Manual removal , Intact         Cord:  3 vessels     Hospital Course : Ilaria Much is a 22 y.o. with an IUP at [redacted]w[redacted]d. She was admitted to the hospital for labor management. Her antepartum course was uncomplicated.  Patient delivered via C-section with labor delivery. Her postpartum course was uncomplicated. She was discharged home in a stable good condition.    Discharge Date: 04/04/2013     Plan:     Follow-up appointment in 6 weeks if vaginal delivery and 2 weeks if c/section.

## 2013-04-04 NOTE — Progress Notes (Signed)
Call the office to schedule your follow up appointment.  Information on how to take care of yourself after baby can be found in the New Mother information care booklet.  Please refer to the back page for a list of reason to call your doctor.  If you have any additional questions or concerns please contact your OB.   Discharge order entered.  Vaccine screening complete and vaccines not given, patient declined.    [place] instructions given.  Questions answered.  Mom [place]'d.

## 2013-04-04 NOTE — Progress Notes (Signed)
Post Op / Postpartum Note    Subjective:  Delivery Type: Cesarean Section   No complaints. Pain well controlled. Minimal Bleeding, Breastfeeding well.passed flatus    Objective:  Vital Signs:  Temp:  [97.2 F (36.2 C)-97.7 F (36.5 C)] 97.2 F (36.2 C)  Heart Rate:  [88-95] 88   Resp Rate:  [16-18] 18   BP: (112-117)/(53-58) 117/58 mmHg  Breast: soft, non tender   Fundus: firm, non tender   Incision: clean, dry and intact .good bowel sounds  Lochia: moderate, rubra.  Lower Extremities: no edema, NT    Recent Labs:    Lab 04/02/13 0530   WBC 22.55*   HGB 10.0*   HCT 30.1*   PLT 180   NEUTROPCT --   LYMPHOPCT --   MONOPCT --   EOSPCT --       Assessment/Plan:  Stable - Post op Day 3  Routine care, discharge today, F/U in 2 weeks incision check.  Discharge Meds: PNV, Percocet.

## 2013-04-04 NOTE — Discharge Instructions (Signed)
[facility]   [facility]      [address], [place], [place] [address] ? Phone: [phone] Fax: [phone]  [email or web address] ? [email or web address]    Marie A. Anderson, M.D., F.A.C.O.G., Chief Medical Officer  John T. Bruchalski, M.D., F.A.C.O.G., Founder    Daniel R. Fisk, M.D., F.A.C.O.G., Chief Financial Officer   Lorna L. Cvetkovich, M.D., F.A.C.O.G.  Myia Bergh C. Brycelyn Gambino, M.D., F.A.C.O.G.        POST PARTUM INSTRUCTIONS    When a woman is about to give birth, she is sad because her hour of suffering has come: but when the baby is born, she forgets her suffering, because she is happy that a baby has been born into the world.  Christ, (John 16:21)    Children are a gift from the Lord; they are a real blessing.  Psalmist, (Psalm 127:3)    And He said: "I tell you the truth, unless you change and become like little children, you will never enter the Kingdom of Heaven.  And whoever welcomes a little child like this in my name welcomes me."  Christ, (Matthew 18:3,5)    CONGRATULATIONS! from all of us at the [facility].  You have made it through labor and delivery, and the transition to a larger family has begun.  The circle continues: the end has become a beginning..    Here are a few tips for the post delivery period to help you take care of yourself, because we look forward to seeing you and the baby at your six to eight week post partum visit.    THE DAY OF DISCHARGE  Once you arrive home, you should rest for the remainder of the day.  Truly try to limit the amount of activity and visitors to prevent excessive fatigue.  To be a good mother, you do not have to do everything on your own all the time.  Ask for help.  People that are helpful, will cook, clean, do laundry, etc. they don't come over to simply hold the baby.  Also, please call [facility] to schedule your six to eight week post partum visit.  This is the optimal time to assess the  cervix for abrasions, eversions, and erosions in preparation for observing the mucus sign of Natural Family Planning after the bleeding and healing have finished.          BABY FEEDING  We at [facility] encourage you to breast feed your newborn infant, however your decision to breast or bottle feed your baby will depend on what is best for your family at this time.  If you have decided to breast feed, hopefully the class at the hospital has provided you with good practical information.  Here are some bits of wisdom:   .This is a learned behavior; it does not come naturally  .There are some situations in which breastfeeding is not possible; do not feel guilty if you truly cannot breast feed  .Be persistent; the more you place the child to the breast, the better you will be at breast feeding  .Your nipples may be very sore for the first two or three weeks; the pain will begin to ease off  .Correctly positioning the baby with the jaws on your areola, will minimize sore or damaged nipples  .Breastfeeding your child for at least six months provides an excellent start to the child's healthy immune system  .Surround yourself with women who have breastfed; they will provide tricks of the trade   to help you through any difficulty  .Decide with your husband whether feeding on demand or on a schedule is the path to be taken for your family  .Lactation consultants and the [employer] are valuable resources    GENERAL ACTIVITY  Fatigue is your number one enemy!  "Be ruthless about getting rest."  Remember moderation in planning your activities.  Gradually increase your activities each day remembering that resting some in both the morning and the afternoon is extremely important.  If there is doubt about doing an activity, either don't do it, or give us a call.  Strenuous work, heavy lifting, and pushing the social calendar are all to be put on hold.  Please ask your spouse, family and friends for help during the post partum  period.  Please make sure that you have at least ten minutes to yourself every day, even if you have numerous children and a busy husband.  Don't feel guilty about this time; we all need some time alone especially when adjusting to the demands of parenting a new baby.    STAIRS  You can climb stairs as soon as you get home.  Just remember that fatigue is part of the recovery process.  Take stairs one at a time at first with someone else carrying the infant for you.  If you live in a multiple level home, try to confine yourself to one level for a few days if at all possible, limiting stair climbing.    DRIVING  You may drive a soon as you are prepared to step on the brake quickly and effectively. (If you had a c-section, please read that section for specific driving restrictions.)  Wearing seatbelt correctly positioned on your body, and having someone with you to help drive if you become fatigued are recommended when driving soon after the delivery experience.      EXERCISE  Slowly increasing your regimen after the first four weeks is recommended.  Sit-ups, leg lifts, crunches and aerobics are all within reason if you start out slowly taking into consideration your decreased blood count and general fatigue.  Kegel exercises, which aid healing and improve pelvic muscle tone, can be started immediately after the delivery along with an increasing walking regimen.  For cesarean deliveries, the exercise may begin after the six to eight week checkup.            SEXUAL RELATIONS  You may not resume sexual relations until you have seen us for your six to eight week post partum visit and only if the bleeding has stopped.      POST PARTUM CARE    Afterbirth Pains  After the birth of your child, the placenta should separate from the womb within thirty minutes.  Lower abdominal cramping will accompany this process.  You will also notice this when the little one breastfeeds.  This is a healthy process to help slow down the  bleeding from the healing uterus.  The cramping should decrease after 48 hours from delivery, but may increase if your activity increases.  Ibuprofen, 600 to 800 milligrams, three times a day is excellent for these pains. If this is your second or third child, some moms say the cramps are more intense.  Please take the medicine; it will help you care for your baby better.  Lastly, sharp pains in the vagina are not normal, so if you experience this, please contact the office.    Ankle Swelling  You may notice that   your ankles are swollen after the delivery, even more than at the end of pregnancy.  Your wrists and hands may also be swollen.  This is the natural process of your body using these areas to store the extra fluid you accumulated during the pregnancy.  You will eventually urinate it out.  This may last for ten to twenty days after your delivery; increased activity will hasten this fluid removal.    Bleeding  This is the most commonly asked question after delivery.  Your bleeding may continue slight to moderate up to six weeks after delivery.  If at any time you pass clots the size of your palm every ten to fifteen minutes, immediately call the office.  You may pass more blood if you are more active, and if you rise from a resting position soon after delivery, the blood pooled in the vagina may leave the birth canal in one large lump.  This is not the same as a flow of blood continuing for a half an hour or more.  Also, the flow may stop and start.  This is due to many factors such as physical activity, breastfeeding changes, or infection with fever.  Most importantly, it is considered very normal.    We recommend that you do not use a tampon until your first period.  Your first period may not only be heavier than normal, but may begin due to your nutritional status, stress level or breastfeeding schedule.  We recommend that all patients begin to notice their vaginal secretions after delivery on a daily  basis, and bring up the topic at your six to eight week checkup.    Cesarean Section  There are special needs following a cesarean delivery.  The pain from a cesarean not only comes from the uterus contracting back to its non-pregnant size, but also from the incision itself.  The ends of the incision are where the knots are placed and these areas are the places where the pulling and burning sensations are usually located.  Prescription pain medicine will be provided to you for the incision pain as necessary.  This interspersed with 800 milligrams of ibuprofen three times a day should keep the pain to a minimum.  Keep the incision clean with soap and water.  The steristrips that cover the incision will become darker and some will peel off in about a week.  If they haven't come off by two weeks after your surgery, please remove them yourself or call the office for help.  No special dressing is necessary.  It is OK to take a shower and get the steristrips wet.  Most commonly, the sutures are buried under the skin so you will not see anything but the incision during the post partum period.  A slight separation in the skin edges with oozing of clear fluid may occur, so clean the area with half peroxide and half water.  Hardness at the incision site and numbness over the area is expected as is a pulling feeling at the sides.  If the incision becomes red and warm to the touch, opens widely, or drains a whitish or yellowish liquid contact the office.  Also, using a small pillow to place gently but firmly over the incision while sitting or standing up will lend extra support to the area.  The incision will need six weeks to get strong enough for all your usual activities.  Please do not lift your older children routinely during this time, and if you   must, use good posture, and sit first before lifting them onto your lap before standing up.  A good rule of thumb is to lift no more than your newborn for the first several weeks  after delivery.  You may not drive until fourteen days after a cesarean.  You may not drive if you are still taking the prescription pain medication. You should have another licensed driver accompany you the first few times and you should wear your seatbelt at all times.  The incision will be 85% healed by the twenty-first day post-op.    Constipation  As Oprah says, the first bowel movement after the birth of your child may be more painful than the birth itself!  Go ahead and increase the fiber in your diet: bran cereal, fresh fruits, green vegetables, Metamucil or Citrucel.  Drinking six to eight glasses of water daily will help prevent constipation.  Pericolace, Senokot, and their generic versions are excellent natural stool softeners which can be taken once or twice daily.  Another suggestion would be to use natural laxatives.  We recommend prunes or their juice eaten or drunk once or twice daily.    Diet  Eating a well balanced diet whether or not you are nursing is crucial to the post delivery time.  Multiple small meals of protein and carbohydrates will ward off hypoglycemia (low blood sugar).  Continuing to take your prenatal vitamins and iron supplement (if this was prescribed) will help decrease the fatigue that follows the normal blood loss after a delivery.  If you are breastfeeding, drinking an extra quart of water is necessary for good milk production, and helps prevent constipation.    Now is the time to reconsider old habits such as cigarette smoking, illicit drug use or alcohol abuse.  If you decreased these habits during pregnancy, it is now even more important to continue this trend or stop the habit all together, because the child is now your shadow.  Please ask the office for help in this regard.    Episiotomy Care  We have tried to minimize the use of cutting an episiotomy at your birth, as we feel that small tears are much easier for your body to heal than incisions that may extend into your  rectum by the pressure of the delivering head of your baby.  However, if we feel that the tear would be extensive and/or close to your urethra and bladder, or your child's decreased heartbeat warrant a rapid delivery, we will place a small episiotomy at the bottom of your vagina.  We use absorbable sutures that will dissolve in approximately three to four weeks.  The discomfort will gradually decrease as the sutures dissolve.  Sitz baths three times daily for the first two weeks will increase their healing, and keep the area clean.  Water sprays to the area after using the restroom, and adjusting the pad so as not to rub this area will keep the episiotomy site clean and minimally irritated.  For the relief of pain, we recommend Dermoplast Spray, witch hazel pads, comfrey soaks, ibuprofen, and avoiding constipation.  You may see a stitch pass between the second and fourth weeks after delivery, this is normal.  If you notice a reddened, tender, pus-filled, foul-smelling episiotomy site, along with a fever of 101 degrees, please call the office.  Also, avoiding lifting items larger than the newborn for the first two weeks will facilitate healing.  Lastly, if you are breastfeeding, the vagina will be quite dry   and this may cause the stitches to itch.  An over the counter steroid like Cortaid cream would be helpful to minimize the itching and discomfort.    Hemorrhoids  Pain, itching and bleeding may all accompany hemorrhoids that developed prior to the birth during the pregnancy.  Prevention of constipation is key in minimizing the effects of your hemorrhoids.  They will also respond to sitz baths and witch hazel pads.  Creams such as Anusol and Proctofoam, along with Preparation H may sooth irritated hemorrhoids.  A prescription may be obtained from us if needed.    Hygiene  Showering and most forms of personal hygiene are perfectly OK.  You should wait until the bleeding has stopped before taking tub baths.  Wait  until your bleeding has completely stopped before using the local pool, lake or ocean.  Also, warm sitz baths help the vaginal heal and decrease the discomfort of hemorrhoids.  With a cesarean section scar, the same rules apply for bathing.    Mood Swings  Your emotions are on a roller coaster ride after the baby's arrival.  Your lack of sleep that accompanies the newborn's arrival plays a critical role in this fluctuation from pleasant and pleased over the miracle addition to the family to tired, irritable and perhaps even depressed for no known reason. Being unable to troubleshoot your child's tears and desires immediately, plus the loss of the hormones that the placenta was dumping into your body just prior to delivery, create the perfect scenario for this emotional snake pit.  Getting rest when you are able and napping when the baby naps is helpful.  Also, some assistance at home for the first few days will help you get some rest.  Being out with friends may be helpful, as well as talking with other new moms.  If you find yourself depressed every day after the first three weeks, please call the office.  Lastly, if you suffered a prior abortion, the sadness from that pregnancy may piggyback onto this present post partum time, so be sure to call the office if you recognize this depression lasting longer than the first three weeks following delivery.    Symptoms to look for:   .Persistent insomnia, besides being up with the little one   .Extreme restlessness   .Extreme fatigue   .Rapid, unexplained mood swings   .Hallucinations   .Decreased appetite or overeating   .Sense of despair, hopelessness or helplessness   .Thoughts or fears of hurting yourself or your baby    Please call the office if you are experiencing any of these symptoms.    Return to Fertility  Breastfeeding exclusively may suppress your return to ovulation after the delivery.  We recommend that you learn to observe your vaginal secretions which  will include cervical mucus.  Start this observation four to six weeks after the birth of your child.  Your past track record with prior pregnancies may be helpful in identifying the return to menses with or without breastfeeding.  If you are minimally breastfeeding, or bottle feeding, your cycles and the possibility of cooperation with God to create another miracle can occur within two to six weeks after the birth.  If you are fully breastfeeding and not using a pacifier, the return to fertile cycles may take up to twelve months or longer to occur.  Please contact the office for more information, because now is the time to reassess your past contraceptive options, and learn about the reproductive wonders of   your body, termed Natural Family Planning.  Please do not hesitate to contact us; we would love to help you with this.  With good instruction, you can understand the language of your body without the unpleasant side effects of hormonal or barrier methods of contraception.    Other Temporary Symptoms That Can Be Experienced   .Heavy perspiration   .Yellow or brown vaginal discharge   .Skin dryness or oiliness   .Hair loss    WORK  You should also be discussing whether you need to return to work.  Many things must be considered.  Pray with your husband with regard to this decision.  If so, perhaps it would be possible to work part-time or from home.  These situations are better, but not always possible.  The most important consideration will be who will care for the baby.  If you have any willing relatives, they are often an excellent choice.  However, if you are not among the few blessed with grandparent daycare providers, there are other options.    Some couples choose for the husband to be the primary care giver.  Most commonly, though, the mother's working outside the home means that the child will be going to daycare.  Choose wisely!  There are many obvious considerations such as locations, licensure, hours,  and cost.  Realize that while these things are important, the primary concern is that you are selecting those who will have the greatest input in the raising of your child in your absence.  Choose people who will accomplish that, the way you would if you were there.  Ask about the ratio of children to care givers.  Meet the people who work there.  Form an impression about their warmth and nurturing ability.  Do they seem happy in their jobs, or do they appear to be overwhelmed or distant?  Assess the safety of the situation.    SOME FINAL THOUGHTS  Children should be welcomed, not wanted or unwanted.  They are the vehicles through which we learn to love unconditionally.  They are welcomed because they are His gifts to us and are on loan to us so we can help raise and educate them, so they will be able to one day see Him face to face.  This is our duty as parents.    Now is the time to love your spouse and thank him for being a part of this miracle while helping him to see the transition from being his wife to now being both wife and mother to his children.  Your primary relationship is still each other, but your duties are no longer exclusively focused on each other but on the family.  This change can be a challenge to see and understand at first.  You can be instrumental in helping him see this reality, and to realize his own transition to both husband and father.  Remember, that men can be slower to perceive certain things.    Just as the best gift a mother can give her children is to love their father, the best gift a father can give his children is to love their mother.  His help in doing the dishes, cleaning the home, cooking the dinners, running the errands, giving 30 minute back massages, and setting up travel plans to warm and sunny places for several months after the delivery are not bad ideas either!  All things are possible with the Lord!    Most importantly, thank the Almighty   Lord for this gift of new  life and dedicate or baptize the child formally as soon as possible.  As you know, the spiritual soul of your child is as important, if not more so as the physical side.  If you are away from your faith, now is the perfect time to go back.  Turn the diapers, colic, and spit ups into praises and thanksgiving to our Father in heaven for this miraculous gift of new life, and look forward to the many ways He will bless your family.

## 2013-04-04 NOTE — Plan of Care (Signed)
Problem: Pain/Discomfort  Goal: Pain/discomfort is manageable.  Outcome: Progressing  Medicated with (2) percocet tablets for incision pain @0315 .  0515 - Pt. States good pain relief.    Devota Pace RN

## 2013-04-09 ENCOUNTER — Ambulatory Visit: Payer: Self-pay

## 2013-09-07 ENCOUNTER — Encounter: Payer: Self-pay | Admitting: Obstetrics & Gynecology

## 2014-05-30 ENCOUNTER — Observation Stay
Admission: EM | Admit: 2014-05-30 | Discharge: 2014-06-01 | Disposition: A | Discharge status: Home / Self Care | Payer: Medicaid Other | Attending: Student in an Organized Health Care Education/Training Program | Admitting: Student in an Organized Health Care Education/Training Program

## 2014-05-30 ENCOUNTER — Observation Stay
Payer: No Typology Code available for payment source | Admitting: Student in an Organized Health Care Education/Training Program

## 2014-05-30 DIAGNOSIS — J45909 Unspecified asthma, uncomplicated: Secondary | ICD-10-CM | POA: Insufficient documentation

## 2014-05-30 DIAGNOSIS — Z23 Encounter for immunization: Secondary | ICD-10-CM | POA: Insufficient documentation

## 2014-05-30 DIAGNOSIS — K8012 Calculus of gallbladder with acute and chronic cholecystitis without obstruction: Principal | ICD-10-CM | POA: Insufficient documentation

## 2014-05-30 DIAGNOSIS — K8000 Calculus of gallbladder with acute cholecystitis without obstruction: Secondary | ICD-10-CM

## 2014-05-30 LAB — COMPREHENSIVE METABOLIC PANEL
ALT: 16 U/L (ref 0–55)
AST (SGOT): 16 U/L (ref 5–34)
Albumin/Globulin Ratio: 0.9 (ref 0.9–2.2)
Albumin: 3.7 g/dL (ref 3.5–5.0)
Alkaline Phosphatase: 86 U/L (ref 37–106)
Anion Gap: 12 (ref 5.0–15.0)
BUN: 9.9 mg/dL (ref 7.0–19.0)
Bilirubin, Total: 0.3 mg/dL (ref 0.2–1.2)
CO2: 20 mEq/L — ABNORMAL LOW (ref 22–29)
Calcium: 8.9 mg/dL (ref 8.5–10.5)
Chloride: 105 mEq/L (ref 100–111)
Creatinine: 0.7 mg/dL (ref 0.6–1.0)
Globulin: 3.9 g/dL — ABNORMAL HIGH (ref 2.0–3.6)
Glucose: 106 mg/dL — ABNORMAL HIGH (ref 70–100)
Potassium: 4.1 mEq/L (ref 3.5–5.1)
Protein, Total: 7.6 g/dL (ref 6.0–8.3)
Sodium: 137 mEq/L (ref 136–145)

## 2014-05-30 LAB — CELL MORPHOLOGY
Cell Morphology: NORMAL
Platelet Estimate: NORMAL

## 2014-05-30 LAB — CBC AND DIFFERENTIAL
Hematocrit: 38.5 % (ref 37.0–47.0)
Hgb: 13 g/dL (ref 12.0–16.0)
MCH: 28 pg (ref 28.0–32.0)
MCHC: 33.8 g/dL (ref 32.0–36.0)
MCV: 83 fL (ref 80.0–100.0)
MPV: 9.9 fL (ref 9.4–12.3)
Platelets: 311 10*3/uL (ref 140–400)
RBC: 4.64 10*6/uL (ref 4.20–5.40)
RDW: 14 % (ref 12–15)
WBC: 17.13 10*3/uL — ABNORMAL HIGH (ref 3.50–10.80)

## 2014-05-30 LAB — GFR: EGFR: >60

## 2014-05-30 LAB — MAN DIFF ONLY
Band Neutrophils Absolute: 0 10*3/uL (ref 0.00–1.00)
Band Neutrophils: 0 %
Basophils Absolute Manual: 0 10*3/uL (ref 0.00–0.20)
Basophils Manual: 0 %
Eosinophils Absolute Manual: 0 10*3/uL (ref 0.00–0.70)
Eosinophils Manual: 0 %
Lymphocytes Absolute Manual: 2.74 10*3/uL (ref 0.50–4.40)
Lymphocytes Manual: 16 %
Monocytes Absolute: 0.69 10*3/uL (ref 0.00–1.20)
Monocytes Manual: 4 %
Neutrophils Absolute Manual: 13.7 10*3/uL — ABNORMAL HIGH (ref 1.80–8.10)
Nucleated RBC: 0 /100 WBC (ref 0–1)
Segmented Neutrophils: 80 %

## 2014-05-30 LAB — LIPASE: Lipase: 12 U/L (ref 8–78)

## 2014-05-30 LAB — AMYLASE: Amylase: 52 U/L (ref 25–125)

## 2014-05-30 MED ORDER — ALUM & MAG HYDROXIDE-SIMETH 200-200-20 MG/5ML PO SUSP
30.0000 mL | Freq: Once | ORAL | Status: AC
Start: 2014-05-30 — End: 2014-05-30
  Administered 2014-05-30: 30 mL via ORAL
  Filled 2014-05-30: qty 30

## 2014-05-30 MED ORDER — MORPHINE SULFATE 4 MG/ML IJ/IV SOLN (WRAP)
4.0000 mg | Freq: Once | Status: AC
Start: 2014-05-30 — End: 2014-05-30
  Administered 2014-05-30: 4 mg via INTRAVENOUS
  Filled 2014-05-30: qty 1

## 2014-05-30 MED ORDER — LIDOCAINE VISCOUS 2 % MT SOLN
10.0000 mL | Freq: Once | OROMUCOSAL | Status: AC
Start: 2014-05-30 — End: 2014-05-30
  Administered 2014-05-30: 10 mL via OROMUCOSAL
  Filled 2014-05-30: qty 15

## 2014-05-30 MED ORDER — PB-HYOSCY-ATROPINE-SCOPOLAMINE 16.2 MG/5ML PO ELIX
10.0000 mL | ORAL_SOLUTION | Freq: Once | ORAL | Status: AC
Start: 2014-05-30 — End: 2014-05-30
  Administered 2014-05-30: 10 mL via ORAL
  Filled 2014-05-30: qty 10

## 2014-05-30 MED ORDER — ONDANSETRON 4 MG PO TBDP
4.0000 mg | ORAL_TABLET | Freq: Once | ORAL | Status: AC
Start: 2014-05-30 — End: 2014-05-30
  Administered 2014-05-30: 4 mg via ORAL
  Filled 2014-05-30: qty 1

## 2014-05-30 MED ORDER — SODIUM CHLORIDE 0.9 % IV BOLUS
1000.0000 mL | Freq: Once | INTRAVENOUS | Status: AC
Start: 2014-05-30 — End: 2014-05-30
  Administered 2014-05-30: 1000 mL via INTRAVENOUS

## 2014-05-30 NOTE — ED Provider Notes (Signed)
Physician/Midlevel provider first contact with patient: 05/30/14 2005         History     Chief Complaint   Patient presents with   . Abdominal Pain   . Chest Pain   . Nausea   . Emesis     HPI Comments: Pt w/ epigastric abdominal pain worsening over the last 4 days.  + nausea and emesis, non-bilious.  Pain worse after eating.  Mild diarrhea daily.    Patient is a 24 y.o. female presenting with abdominal pain. The history is provided by the patient.   Abdominal Pain  Pain location:  Epigastric  Pain quality: sharp    Pain radiates to:  Back  Pain severity:  Severe  Onset quality:  Gradual  Duration:  4 days  Timing:  Intermittent  Progression:  Worsening  Chronicity:  New  Context: eating    Context: not recent illness and not sick contacts    Relieved by:  Nothing  Ineffective treatments: alka seltzer.  Associated symptoms: chest pain, nausea and vomiting    Associated symptoms: no cough, no dysuria, no fever, no hematuria and no sore throat    Chest pain:     Quality:  Burning and sharp    Severity:  Severe    Onset quality:  Gradual    Duration:  4 days    Timing:  Constant    Progression:  Waxing and waning    Chronicity:  New  Vomiting:     Quality:  Stomach contents    Number of occurrences:  2x/day    Severity:  Moderate    Vomiting duration: 2.    Timing:  Intermittent    Progression:  Unchanged     Past Medical History   Diagnosis Date   . Asthma without status asthmaticus      albuterol inhaler   no h/o GI dz    Past Surgical History   Procedure Laterality Date   . Cesarean section  04/01/2013     Procedure: CESAREAN SECTION;  Surgeon: Daine Gip, MD;  Location: [facility];  Service: Obstetrics;  Laterality: N/A;       Family History   Problem Relation Age of Onset   . No known problems Mother    . No known problems Father    . Asthma Brother        Social  History   Substance Use Topics   . Smoking status: Never Smoker    . Smokeless tobacco: Not on file   . Alcohol Use: No   No known  sick contacts.  Lives at home w/ family  .     No Known Allergies    Current Discharge Medication List      CONTINUE these medications which have NOT CHANGED    Details   Aspirin Effervescent (ALKA-SELTZER PO) Take by mouth.              Review of Systems   Constitutional: Negative for fever and activity change.   HENT: Negative for congestion and sore throat.    Eyes: Negative for discharge and redness.   Respiratory: Negative for cough and wheezing.    Cardiovascular: Positive for chest pain. Negative for palpitations.   Gastrointestinal: Positive for nausea, vomiting and abdominal pain.   Genitourinary: Negative for dysuria and hematuria.   Musculoskeletal: Negative for joint swelling and arthralgias.   Skin: Negative for color change and rash.   Neurological: Negative for dizziness and headaches.  Psychiatric/Behavioral: Negative for self-injury and agitation.       Physical Exam    BP: 124/57 mmHg, Heart Rate: 74, Temp: 98.1 F (36.7 C), Resp Rate: 18, SpO2: 100 %, Weight: 106.2 kg    Physical Exam   Constitutional: She is oriented to person, place, and time. Vital signs are normal. She appears well-developed. No distress.   HENT:   Head: Normocephalic and atraumatic.   Nose: Nose normal.   Mouth/Throat: Oropharynx is clear and moist.   Eyes: EOM are normal. Right eye exhibits no discharge. Left eye exhibits no discharge.   Neck: Normal range of motion. Neck supple.   Cardiovascular: Normal rate, regular rhythm and normal heart sounds.    No murmur heard.  Pulmonary/Chest: Effort normal and breath sounds normal. No respiratory distress. She has no wheezes.   Abdominal: Soft. Bowel sounds are normal. She exhibits no distension and no mass. There is no tenderness.   Musculoskeletal: Normal range of motion. She exhibits no tenderness.   Neurological: She is alert and oriented to person, place, and time. She has normal strength. No sensory deficit.   Skin: Skin is warm. No rash noted.   Psychiatric: She has a  normal mood and affect. Her speech is normal. Cognition and memory are normal.   Nursing note and vitals reviewed.        MDM and ED Course     ED Medication Orders     Start     Status Ordering Provider    05/30/14 2127  morphine injection 4 mg   Once     Route: Intravenous  Ordered Dose: 4 mg     Last MAR action:  Given Milani Lowenstein    05/30/14 2127  sodium chloride 0.9 % bolus 1,000 mL   Once     Route: Intravenous  Ordered Dose: 1,000 mL     Last MAR action:  Stopped Carrson Lightcap    05/30/14 2044  ondansetron (ZOFRAN-ODT) disintegrating tablet 4 mg   Once     Route: Oral  Ordered Dose: 4 mg     Last MAR action:  Given Shail Urbas    05/30/14 2027  lidocaine viscous (XYLOCAINE) 2 % mouth solution 10 mL   Once     Route: Mouth/Throat  Ordered Dose: 10 mL     Last MAR action:  Given Johntavious Francom    05/30/14 2027  alum & mag hydroxide-simethicone (MAALOX PLUS) 200-200-20 mg/5 mL suspension 30 mL   Once     Route: Oral  Ordered Dose: 30 mL     Last MAR action:  Given Sylvan Sookdeo    05/30/14 2027  PHENobarbital-belladonna alkaloids (DONNATAL) elixir 10 mL   Once     Route: Oral  Ordered Dose: 10 mL     Last MAR action:  Given Shelvy Perazzo              MDM  Number of Diagnoses or Management Options  Acute calculous cholecystitis:   Diagnosis management comments: Oxygen saturation by pulse oximetry is 95%-100%, Normal.  Interventions: None Needed.    Ddx: gastroenteritis  Gastritis  GERD  Cholecystitis    Pt unable to eat without pain.  S/p morphine, pain improved.  Will admit for pain control, keep NPO and consult surgery.  Patient understands and agrees w/ plan.           Amount and/or Complexity of Data Reviewed  Clinical lab tests: ordered and reviewed  Tests in the radiology  section of CPT: ordered and reviewed      Results     Procedure Component Value Units Date/Time    Manual Differential [[id number]]  (Abnormal) Collected:  05/30/14 2148     Segmented Neutrophils 80 % Updated:  05/30/14 2220     Band  Neutrophils 0 %      Lymphocytes Manual 16 %      Monocytes Manual 4 %      Eosinophils Manual 0 %      Basophils Manual 0 %      Nucleated RBC 0 /100 WBC      Abs Seg Manual 13.70 (H) x10 3/uL      Bands Absolute 0.00 x10 3/uL      Absolute Lymph Manual 2.74 x10 3/uL      Monocytes Absolute 0.69 x10 3/uL      Absolute Eos Manual 0.00 x10 3/uL      Absolute Baso Manual 0.00 x10 3/uL     Cell MorpHology [[id number]] Collected:  05/30/14 2148     Cell Morphology: Normal Updated:  05/30/14 2220     Platelet Estimate Normal     CBC with differential [[id number]]  (Abnormal) Collected:  05/30/14 2148    Specimen Information:  Blood / Blood Updated:  05/30/14 2220     WBC 17.13 (H) x10 3/uL      Hgb 13.0 g/dL      Hematocrit 29.5 %      Platelets 311 x10 3/uL      RBC 4.64 x10 6/uL      MCV 83.0 fL      MCH 28.0 pg      MCHC 33.8 g/dL      RDW 14 %      MPV 9.9 fL     Comprehensive metabolic panel [[id number]]  (Abnormal) Collected:  05/30/14 2148    Specimen Information:  Blood Updated:  05/30/14 2218     Glucose 106 (H) mg/dL      BUN 9.9 mg/dL      Creatinine 0.7 mg/dL      Sodium 301 mEq/L      Potassium 4.1 mEq/L      Chloride 105 mEq/L      CO2 20 (L) mEq/L      CALCIUM 8.9 mg/dL      Protein, Total 7.6 g/dL      Albumin 3.7 g/dL      AST (SGOT) 16 U/L      ALT 16 U/L      Alkaline Phosphatase 86 U/L      Bilirubin, Total 0.3 mg/dL      Globulin 3.9 (H) g/dL      Albumin/Globulin Ratio 0.9      Anion Gap 12.0     Lipase [[id number]] Collected:  05/30/14 2148    Specimen Information:  Blood Updated:  05/30/14 2218     Lipase 12 U/L     Amylase [[id number]] Collected:  05/30/14 2148    Specimen Information:  Blood Updated:  05/30/14 2218     Amylase 52 U/L     GFR [[id number]] Collected:  05/30/14 2148     EGFR >60.0 Updated:  05/30/14 2218        Radiology Results (24 Hour)     Procedure Component Value Units Date/Time    US Abdomen Limited Ruq [[id number]] Collected:  05/31/14 0155    Order Status:  Completed Updated:   05/31/14 0207    Narrative:  History:  Epigastric abdominal pain and vomiting.    Sonographic examination of the right upper quadrant of the abdomen was  performed. The gallbladder is distended. There is diffuse moderate  gallbladder wall thickening. There is a small amount of pericholecystic  fluid. Several layering subcentimeter calcified gallstones are  demonstrated in the fundal gallbladder lumen. Assessment for sonographic  Eulah Pont sign is limited due to patient analgesia. The visualized bile  ducts are nondilated. The common bile duct measures 4 mm in diameter. No  choledocholithiasis is demonstrated.    The liver parenchyma is diffusely mildly echogenic with minimal  posterior acoustic attenuation, in keeping with mild diffuse hepatic  steatosis. No liver mass is detected, noting decreased sensitivity in  the setting of an echogenic liver. Liver surface appears smooth. The  limited visualized portions of the pancreas appear normal.    No aneurysmal dilation of the abdominal aorta is demonstrated. The  visualized portions of the IVC are unremarkable.  There is no right  upper quadrant ascites. Limited evaluation of the right kidney  demonstrates normal size (right renal length 12.0 cm) and no right  hydronephrosis.      Impression:          1. Distended and thick walled gallbladder with cholelithiasis and  pericholecystic fluid. These findings are suspicious for acute calculus  cholecystitis, although sonographic evaluation is limited by patient  analgesia and clinical correlation is necessary.    2. No biliary ductal dilatation.    3. Mild diffuse hepatic steatosis.    Cleone Slim, MD   05/31/2014 2:03 AM                 Procedures    Clinical Impression & Disposition     Clinical Impression  Final diagnoses:   Acute calculous cholecystitis        ED Disposition     Admit Bed Type: General [8]  Admitting Physician: Elgie Congo [[id number]]  Patient Class: Observation [104]             Current Discharge  Medication List                      Francena Hanly, MD  05/31/14 [phone]

## 2014-05-30 NOTE — ED Notes (Addendum)
4 days of vomiting. 'My chest hurts (points to epigastric area), my stomach hurts and I feel very dizzy at times. The pain moves down (points to lower abdomen)  if I lay down', +headache. Diarrhea for 2 days. No cough. Patient has voided 4 times today, emesis once today. No other family member is ill

## 2014-05-31 ENCOUNTER — Encounter
Admission: EM | Disposition: A | Discharge status: Home / Self Care | Payer: Self-pay | Source: Home / Self Care | Attending: Pediatrics

## 2014-05-31 ENCOUNTER — Ambulatory Visit: Payer: Self-pay

## 2014-05-31 ENCOUNTER — Observation Stay: Payer: No Typology Code available for payment source | Admitting: Anesthesiology

## 2014-05-31 ENCOUNTER — Observation Stay: Payer: Medicaid Other | Admitting: Anesthesiology

## 2014-05-31 ENCOUNTER — Emergency Department: Payer: Medicaid Other

## 2014-05-31 DIAGNOSIS — K8000 Calculus of gallbladder with acute cholecystitis without obstruction: Secondary | ICD-10-CM | POA: Diagnosis present

## 2014-05-31 HISTORY — PX: LAPAROSCOPIC, CHOLECYSTECTOMY: SHX4474

## 2014-05-31 SURGERY — LAPAROSCOPIC, CHOLECYSTECTOMY
Anesthesia: Anesthesia General | Site: Abdomen | Wound class: Clean Contaminated

## 2014-05-31 MED ORDER — ONDANSETRON HCL 4 MG/2ML IJ SOLN
INTRAMUSCULAR | Status: DC | PRN
Start: 2014-05-31 — End: 2014-05-31
  Administered 2014-05-31: 4 mg via INTRAVENOUS

## 2014-05-31 MED ORDER — LACTATED RINGERS IV SOLN
INTRAVENOUS | Status: DC
Start: 2014-05-31 — End: 2014-05-31

## 2014-05-31 MED ORDER — HYDROMORPHONE HCL 1 MG/ML IJ SOLN
INTRAMUSCULAR | Status: AC
Start: 2014-05-31 — End: 2014-05-31
  Administered 2014-05-31: 0.5 mg via INTRAVENOUS
  Filled 2014-05-31: qty 1

## 2014-05-31 MED ORDER — HYDROMORPHONE HCL 1 MG/ML IJ SOLN
INTRAMUSCULAR | Status: DC | PRN
Start: 2014-05-31 — End: 2014-05-31
  Administered 2014-05-31: 1 mg via INTRAVENOUS

## 2014-05-31 MED ORDER — ROCURONIUM BROMIDE 50 MG/5ML IV SOLN
INTRAVENOUS | Status: AC
Start: 2014-05-31 — End: ?
  Filled 2014-05-31: qty 5

## 2014-05-31 MED ORDER — PROPOFOL 10 MG/ML IV EMUL
INTRAVENOUS | Status: AC
Start: 2014-05-31 — End: ?
  Filled 2014-05-31: qty 20

## 2014-05-31 MED ORDER — DEXAMETHASONE SOD PHOSPHATE PF 10 MG/ML IJ SOLN
INTRAMUSCULAR | Status: AC
Start: 2014-05-31 — End: ?
  Filled 2014-05-31: qty 1

## 2014-05-31 MED ORDER — OXYCODONE-ACETAMINOPHEN 5-325 MG PO TABS
1.0000 | ORAL_TABLET | ORAL | Status: DC | PRN
Start: 2014-05-31 — End: 2014-06-01
  Administered 2014-06-01: 1 via ORAL
  Filled 2014-05-31: qty 1

## 2014-05-31 MED ORDER — OXYCODONE-ACETAMINOPHEN 5-325 MG PO TABS
2.0000 | ORAL_TABLET | ORAL | Status: DC | PRN
Start: 2014-05-31 — End: 2014-06-01
  Administered 2014-05-31: 2 via ORAL
  Filled 2014-05-31: qty 2

## 2014-05-31 MED ORDER — SUCCINYLCHOLINE CHLORIDE 20 MG/ML IJ SOLN
INTRAMUSCULAR | Status: AC
Start: 2014-05-31 — End: ?
  Filled 2014-05-31: qty 10

## 2014-05-31 MED ORDER — SUCCINYLCHOLINE CHLORIDE 20 MG/ML IJ SOLN
INTRAMUSCULAR | Status: DC | PRN
Start: 2014-05-31 — End: 2014-05-31
  Administered 2014-05-31: 150 mg via INTRAVENOUS

## 2014-05-31 MED ORDER — MEPERIDINE HCL 25 MG/ML IJ SOLN
25.0000 mg | Freq: Once | INTRAMUSCULAR | Status: DC | PRN
Start: 2014-05-31 — End: 2014-05-31

## 2014-05-31 MED ORDER — HYDROMORPHONE HCL 1 MG/ML IJ SOLN
INTRAMUSCULAR | Status: AC
Start: 2014-05-31 — End: ?
  Filled 2014-05-31: qty 1

## 2014-05-31 MED ORDER — HYDROMORPHONE HCL 1 MG/ML IJ SOLN
1.0000 mg | INTRAMUSCULAR | Status: DC | PRN
Start: 2014-05-31 — End: 2014-06-01

## 2014-05-31 MED ORDER — NALOXONE HCL 0.4 MG/ML IJ SOLN
0.2000 mg | INTRAMUSCULAR | Status: DC | PRN
Start: 2014-05-31 — End: 2014-06-01

## 2014-05-31 MED ORDER — PROMETHAZINE HCL 25 MG/ML IJ SOLN
6.2500 mg | Freq: Once | INTRAMUSCULAR | Status: DC | PRN
Start: 2014-05-31 — End: 2014-05-31

## 2014-05-31 MED ORDER — OXYCODONE-ACETAMINOPHEN 5-325 MG PO TABS
1.0000 | ORAL_TABLET | Freq: Once | ORAL | Status: DC | PRN
Start: 2014-05-31 — End: 2014-05-31

## 2014-05-31 MED ORDER — DEXTROSE-SODIUM CHLORIDE 5-0.45 % IV SOLN
INTRAVENOUS | Status: DC
Start: 2014-05-31 — End: 2014-06-01

## 2014-05-31 MED ORDER — LIDOCAINE HCL (PF) 2 % IJ SOLN
INTRAMUSCULAR | Status: AC
Start: 2014-05-31 — End: ?
  Filled 2014-05-31: qty 5

## 2014-05-31 MED ORDER — NEOSTIGMINE METHYLSULFATE 1 MG/ML IJ SOLN
INTRAMUSCULAR | Status: DC | PRN
Start: 2014-05-31 — End: 2014-05-31
  Administered 2014-05-31: 6 mL via INTRAVENOUS

## 2014-05-31 MED ORDER — ONDANSETRON HCL 4 MG/2ML IJ SOLN
INTRAMUSCULAR | Status: AC
Start: 2014-05-31 — End: ?
  Filled 2014-05-31: qty 2

## 2014-05-31 MED ORDER — HYDROMORPHONE HCL 1 MG/ML IJ SOLN
0.5000 mg | INTRAMUSCULAR | Status: DC | PRN
Start: 2014-05-31 — End: 2014-05-31

## 2014-05-31 MED ORDER — BUPIVACAINE-EPINEPHRINE (PF) 0.25% -1:200000 IJ SOLN
INTRAMUSCULAR | Status: AC
Start: 2014-05-31 — End: ?
  Filled 2014-05-31: qty 30

## 2014-05-31 MED ORDER — PROPOFOL 10 MG/ML IV EMUL
INTRAVENOUS | Status: DC | PRN
Start: 2014-05-31 — End: 2014-05-31
  Administered 2014-05-31: 200 mg via INTRAVENOUS

## 2014-05-31 MED ORDER — ONDANSETRON HCL 4 MG/2ML IJ SOLN
4.0000 mg | Freq: Once | INTRAMUSCULAR | Status: DC | PRN
Start: 2014-05-31 — End: 2014-05-31

## 2014-05-31 MED ORDER — LACTATED RINGERS IV SOLN
INTRAVENOUS | Status: DC | PRN
Start: 2014-05-31 — End: 2014-05-31

## 2014-05-31 MED ORDER — MORPHINE SULFATE 2 MG/ML IJ/IV SOLN (WRAP)
2.0000 mg | Status: DC | PRN
Start: 2014-05-31 — End: 2014-05-31

## 2014-05-31 MED ORDER — BUPIVACAINE-EPINEPHRINE (PF) 0.25% -1:200000 IJ SOLN
INTRAMUSCULAR | Status: DC | PRN
Start: 2014-05-31 — End: 2014-05-31
  Administered 2014-05-31: 30 mL via INTRAMUSCULAR

## 2014-05-31 MED ORDER — INFLUENZA VAC SPLIT QUAD 0.5 ML IM SUSY
0.5000 mL | PREFILLED_SYRINGE | INTRAMUSCULAR | Status: AC | PRN
Start: 2014-05-31 — End: 2014-06-01
  Administered 2014-06-01: 0.5 mL via INTRAMUSCULAR
  Filled 2014-05-31: qty 0.5

## 2014-05-31 MED ORDER — DEXAMETHASONE SODIUM PHOSPHATE 4 MG/ML IJ SOLN
INTRAMUSCULAR | Status: DC | PRN
Start: 2014-05-31 — End: 2014-05-31
  Administered 2014-05-31: 10 mg via INTRAVENOUS

## 2014-05-31 MED ORDER — MIDAZOLAM HCL 2 MG/2ML IJ SOLN
INTRAMUSCULAR | Status: DC | PRN
Start: 2014-05-31 — End: 2014-05-31
  Administered 2014-05-31: 2 mg via INTRAVENOUS

## 2014-05-31 MED ORDER — SODIUM CHLORIDE 0.9 % IV MBP
4.5000 g | Freq: Three times a day (TID) | INTRAVENOUS | Status: DC
Start: 2014-05-31 — End: 2014-06-01
  Administered 2014-05-31 – 2014-06-01 (×4): 4.5 g via INTRAVENOUS
  Filled 2014-05-31 (×6): qty 20

## 2014-05-31 MED ORDER — ONDANSETRON HCL 4 MG/2ML IJ SOLN
4.0000 mg | Freq: Four times a day (QID) | INTRAMUSCULAR | Status: DC | PRN
Start: 2014-05-31 — End: 2014-06-01
  Administered 2014-05-31: 4 mg via INTRAVENOUS
  Filled 2014-05-31: qty 2

## 2014-05-31 MED ORDER — FENTANYL CITRATE 0.05 MG/ML IJ SOLN
INTRAMUSCULAR | Status: AC
Start: 2014-05-31 — End: ?
  Filled 2014-05-31: qty 5

## 2014-05-31 MED ORDER — FENTANYL CITRATE 0.05 MG/ML IJ SOLN
INTRAMUSCULAR | Status: DC | PRN
Start: 2014-05-31 — End: 2014-05-31
  Administered 2014-05-31: 100 ug via INTRAVENOUS
  Administered 2014-05-31: 50 ug via INTRAVENOUS
  Administered 2014-05-31: 100 ug via INTRAVENOUS

## 2014-05-31 MED ORDER — MIDAZOLAM HCL 2 MG/2ML IJ SOLN
INTRAMUSCULAR | Status: AC
Start: 2014-05-31 — End: ?
  Filled 2014-05-31: qty 2

## 2014-05-31 MED ORDER — ROCURONIUM BROMIDE 50 MG/5ML IV SOLN
INTRAVENOUS | Status: DC | PRN
Start: 2014-05-31 — End: 2014-05-31
  Administered 2014-05-31: 5 mg via INTRAVENOUS
  Administered 2014-05-31: 15 mg via INTRAVENOUS

## 2014-05-31 MED ORDER — MORPHINE SULFATE 4 MG/ML IJ/IV SOLN (WRAP)
4.0000 mg | Status: DC | PRN
Start: 2014-05-31 — End: 2014-05-31

## 2014-05-31 SURGICAL SUPPLY — 36 items
APPLCATOR CHLORAPREP 26ML (Prep) ×2 IMPLANT
APPLIER IN CLP TI LG E-CLP SUP INTLK 10 (Endoscopic Supplies) ×1
APPLIER INTERNAL CLIP LARGE L33 CM (Endoscopic Supplies) ×1
APPLIER INTERNAL CLIP LARGE L33 CM TITANIUM PISTOL GRIP GLARE (Endoscopic Supplies) ×1 IMPLANT
CANULA STABILITY 5MM (Procedure Accessories) ×4 IMPLANT
CATH URETHRAL XRAY WHIST 5F (Catheter Urine) IMPLANT
CLOSURE STERI-STRIP 1X5IN (Dressing) ×2 IMPLANT
COVER CAM LF STRL LEN DISP CLR (Procedure Accessories) ×2
COVER CAMERA LENS DISPOSABLE CLEAR STERILE LATEX FREE (Procedure Accessories) ×1 IMPLANT
ELECTRODE ELECTROSURGICAL J HOOK (Patient Supply) ×1
ELECTRODE ELECTROSURGICAL J HOOK STANDARD L32 CM OD5 MM CONMED EXTEND (Patient Supply) ×1 IMPLANT
ELECTRODE ESURG J HK STD 5MM 32CM STRL (Patient Supply) ×1
GLOVE SURGEONS STRL LTX SZ8 (Glove) ×2 IMPLANT
KIT HEMOSTATIC MALLEABLE APPLICATOR FLOSEAL 13CM MATRIX 5ML (Hemostat) IMPLANT
KIT HMST MTRX 5ML 13CM FLSL MLBL APL (Hemostat)
KIT LAP CHOLECYSTECTOMY MCDOW (Pack) ×2 IMPLANT
NEEDLE INSFL SS 14GA 12CM LTX STRL HFLO (Needles) ×1
NEEDLE INSUFFLATION L12 CM OD14 GA (Needles) ×1
NEEDLE INSUFFLATION L12 CM OD14 GA EXCEL PNEUMOPERITONEUM HIGH FLOW (Needles) ×1 IMPLANT
POUCH SPEC RTRVL PU E-CTCH GLD 10MM 34.5 (Laparoscopy Supplies) ×1
POUCH SPECIMEN RETRIEVAL L34.5 CM (Laparoscopy Supplies) ×1
POUCH SPECIMEN RETRIEVAL L34.5 CM ERGONOMIC HANDLE LONG CYLINDRICAL (Laparoscopy Supplies) ×1 IMPLANT
SLEEVE CMPR NYL MED THG LGTH SCD EXP LF (Sleeve) ×2
SLEEVE COMPRESSION NYLON MEDIUM THIGH LENGTH KENDALL ADJUSTABLE (Sleeve) ×1 IMPLANT
SOLUTION IRR LR 3L ARTHMTC LF PLS CNTNR (Irrigation Solutions) ×1
SOLUTION IRRIGATION LACTATED RINGERS (Irrigation Solutions) ×1
SOLUTION IRRIGATION LACTATED RINGERS 3000 ML PLASTIC CONTAINER (Irrigation Solutions) ×1 IMPLANT
SPONGE HEMOSTATIC SURGICEL 4X8 (Dressing) ×2 IMPLANT
SUTURE ABS 4-0 PC5 VCL MTPS 18IN BRD (Suture) ×1
SUTURE COATED VICRYL 4-0 PC-5 L18 IN (Suture) ×1
SUTURE COATED VICRYL 4-0 PC-5 L18 IN BRAID COATED UNDYED ABSORBABLE (Suture) ×1 IMPLANT
SUTURE VICRYL 0 UR6 27IN (Suture) ×2 IMPLANT
TIP SCISSORS 2 ACTION CURVE W5 MM (Endoscopic Supplies) ×1 IMPLANT
TIP SCSR CRV VM METZ 5MM 2 ACT DISP (Endoscopic Supplies) ×1
TROCAR BLADELESS ENDO 12X10MM (Laparoscopy Supplies) ×2 IMPLANT
TROCAR BLADELESS ENDO 5X100MM (Laparoscopy Supplies) ×2 IMPLANT

## 2014-05-31 NOTE — Anesthesia Postprocedure Evaluation (Signed)
Anesthesia Post Evaluation    Patient: Nekeya Briski    Procedure(s):  LAPAROSCOPIC, CHOLECYSTECTOMY    Anesthesia type: general    Last Vitals:   Filed Vitals:    05/31/14 1430   BP: 108/57   Pulse: 82   Temp:    Resp: 17   SpO2: 97%       Patient Location: Phase I PACU      Post Pain: Patient not complaining of pain, continue current therapy    Mental Status: awake and alert    Respiratory Function: tolerating nasal cannula    Cardiovascular: stable    Nausea/Vomiting: patient not complaining of nausea or vomiting    Hydration Status: adequate    Post Assessment: no apparent anesthetic complications, no reportable events and no evidence of recall          Anesthesia Qualified Clinical Data Registry    1.  CVC insertion : NO                                               2.  General/neuraxial anesthesia > or = 60 minutes (excluding CABG) : YES              > Use of intraoperative active warming : YES              > Temperature > or = 36 degrees Centigrade (96.8 degrees Farenheit) during time span from 30 minutes before up to 15 minutes after anesthesia end time : YES      3.  Age > or = 18, with IV access, with surgical procedure for which antibiotic prophylaxis indicated, and not on chronic antibiotics : YES              > Prophylactic antibiotics within 1 hour of incision (or fluroroquinolone/vancomycin within 2 hours of incision) : YES    4.  Ordering or administration of drug inconsistent with intended drug, dose, delivery or timing : NO      5.  Dental injury with administration of anesthesia : NO      6.  Elective airway procedure including but not limited to: tracheostomy, fiberoptic bronchoscopy, rigid bronchoscopy; jet ventilation; or elective use of a device to facilitate airway management such as a Glidescope : NO                > Unanticipated difficult intubation post pre-evaluation : NO      7.  Aspiration of gastric contents : NO                    8.  Procedure requiring electrocautery/laser :  YES                > Ignition/burning in invasive procedure location : NO      9.  Cardiac arrest in OR or PACU : NO                    10.  Unplanned hospital admission for initially intended outpatient anesthesia service : NO      11.  Unplanned ICU admission related to anesthesia occurring within 24 hours of induction or start of MAC : NO      12.  Cancellation of procedure after care already started by anesthesia care team : NO      13.  Transfer from OR or PACU upon case conclusion : YES              > Use of PACU transfer checklist/protocol (includes the key elements of: patient identification, responsible practitioner identification (PACU nurse or advanced practitioner), discussion of pertinent history and procedure course, intraoperative anesthetic management, post-procedure plans, acknowledgement/questions) : YES      14.  Transfer from OR or ICU upon case conclusion : NO                    15. Post-operative nausea/vomiting risk protocol. Patient > or = 18 with care initiated by anesthesia team that has a risk factor screen for post-op nausea/vomiting (Includes female, hx PONV, or motion sickness, non-smoker, intended opioid administration for post-op analgesia.) : NO    16.  Anaphylaxis secondary to anesthesia : NO      17.  Suspected transfusion reaction in association with blood-bank confirmed product incompatibility: NO        Wanita Chamberlain, 05/31/2014 2:39 PM

## 2014-05-31 NOTE — Progress Notes (Signed)
Pt return from PACU in bed, alert, oriented, VS wnl, incentive spirometry in use, sequentials in place. Refused offer at this time to call family member with update. Wallet remains in [employer].

## 2014-05-31 NOTE — Plan of Care (Signed)
Problem: Health Promotion  Goal: Vaccination Screening  All patients will be screened for current vaccination status on each admission.   Outcome: Progressing  Patient voices interest in receiving flu vaccine prior to discharge.  Consent obtained.    Problem: Pain  Goal: Patient's pain/discomfort is manageable  Outcome: Progressing  Patient resting comfortably in bed, denies pain.  Patient voices understanding of pain scale and need for appropriate pain control measures.

## 2014-05-31 NOTE — Brief Op Note (Signed)
BRIEF OP NOTE    Date Time: 05/31/2014 1:24 PM    Patient Name:   Dana Norman    Date of Operation:   05/31/2014    Providers Performing:   Surgeon(s):  Amali Uhls, Debbe Mounts., MD    Assistant (s):   Circulator: Mayme Genta, RN  Scrub Person: Hillery Jacks, RN    Operative Procedure:   Procedure(s):  LAPAROSCOPIC, CHOLECYSTECTOMY    Preoperative Diagnosis:   Pre-Op Diagnosis Codes:     * Cholecystitis [K81.9]    Postoperative Diagnosis:   Post-Op Diagnosis Codes:     * Cholecystitis [K81.9]    Anesthesia:   General    Estimated Blood Loss:    * No values recorded between 05/31/2014 12:28 PM and 05/31/2014  1:24 PM *    Implants:   * No implants in log *    Drains:   Drains: no    Specimens:        SPECIMENS (last 24 hours)      Pathology Specimens       05/31/14 1300             Additional Information    Send final report to: Rolla Servidio md        Specimen Information    Specimen Testing Required Routine Pathology       Specimen ID  [id number]       Specimen Description gallbladder and contents            Findings:   Acute Cholecystitis, cholelithiasis    Complications:   None Immediate      Signed by: Buelah Manis., MD                                                                           [facility]

## 2014-05-31 NOTE — Op Note (Signed)
Procedure Date: 05/31/2014     Patient Type: V     SURGEON: Manson Passey MD  ASSISTANT:       PREOPERATIVE DIAGNOSES:  Acute cholecystitis, cholelithiasis.     POSTOPERATIVE DIAGNOSES:  Acute cholecystitis, cholelithiasis.     TITLE OF PROCEDURE:  Laparoscopic cholecystectomy.     ANESTHESIOLOGIST:  Dr. Durel Salts.     DESCRIPTION OF PROCEDURE:  Following adequate preoperative evaluation and preparation, the patient was  taken to the operating room.  Preoperative evaluation had included an open  and candid as well as thorough discussion with regard to the therapeutic  options ranging from continued expectant/medical therapy to gallstone  lithotripsy, gallstone dissolution, and cholecystectomy, both open and  laparoscopic techniques.  Details of each therapeutic modality, potential  risks and/or hazards, possible complications, reasonable expectations,  anticipated degree of disability and recovery from surgical procedures were  all discussed at that time.  The possibility of complications unique to  laparoscopic techniques was also discussed, including but not limited to  ductal injuries, trocar or cannula injuries, etc.  At the conclusion of the  consultation, the patient elected to proceed with laparoscopic  cholecystectomy.  The patient was subsequently placed in the supine  position and following the induction of adequate general anesthesia  utilizing an endotracheal technique, the abdomen was prepped and draped in  the usual sterile fashion.  A Foley catheter was placed.  Pneumatic  compression stockings were placed and an orogastric tube passed.  Attention  was initially directed to the umbilical area, where an approximately 1-cm  vertical incision was made in the umbilicus.  A Veress needle was  introduced into the peritoneal space, its placement confirmed with a saline  drop test.  The Veress needle was connected to the insufflator and with a  low pressure reading, the abdomen was insufflated with CO2 gas  to a  pressure of 14 mmHg.  A 10-mm trocar and cannula was passed through the  umbilical site without difficulty, through which the [employer]  camera and light source were introduced.  Three additional upper abdominal  cannulas were placed under direct vision, a 10-mm cannula in the  mid-epigastric region, a 5-mm cannula near the mid-clavicular line and a  lateral 5-mm cannula.  The gallbladder was subsequently exposed.  The  patient was placed in reverse Trendelenburg position and the gallbladder  reflected in a cephalad direction.  The infundibulum of the gallbladder was  grasped.  The cholecystoduodenal ligament was exposed and dissected  utilizing blunt and sharp dissection.  The cystic duct and artery were  dissected free of the surrounding soft tissues.  The cystic duct was  controlled at the junction of the infundibulum of the gallbladder with an  Endoclip.  The cystic duct and the common hepatic duct could be clearly  visualized with regard to anatomical relationship and due to the diminutive  size of the cystic duct and technical inability to successfully complete an  operative cholangiogram combined with the absence of preoperative symptoms  suggesting choledocholithiasis or common duct sequelae, it was elected to  omit an operative cholangiogram and not expose the patient to further  hazardous manipulation.  Visualization of the extrahepatic biliary tree was  clearly a factor in this judgmental decision made intraoperatively.  The  cystic duct was subsequently doubly clamped proximally, the cystic artery  doubly clamped proximally and clamped distally with Endoclips and divided.   Special care was taken not to encroach upon or otherwise injure the right  hepatic duct, common hepatic duct, or common bile duct during this portion  of the procedure.  The gallbladder was subsequently dissected free of its  capsule and liver bed utilizing the electrical cautery and hook dissector.   Prior to completion  of this portion of the procedure, the gallbladder bed  and region of the cystic artery were inspected for hemostasis and noted to  be entirely satisfactory.  Intra-abdominal pressure was allowed to fall to  approximately 6-7 mmHg during this inspection, which again confirmed  adequate hemostasis.  Gallbladder dissection was completed and the  gallbladder extricated by means of the mid-epigastric port without  difficulty and intact.  The subhepatic space had been irrigated with  heparinized saline solution throughout the procedure, was re-irrigated, and  all possible irrigant fluid was aspirated from the abdominal cavity.   Hemostasis was again affirmed and the upper abdominal cannula sites  infiltrated with 0.25% Marcaine with epinephrine for postoperative  analgesia.  The cannulas were withdrawn under direct vision, with no active  bleeding being seen from the cannula sites.  The procedure was terminated.   The camera was withdrawn from the abdomen and all possible CO2 gas  evacuated from the abdominal cavity.  The midline fascial layers were  closed with interrupted 0 Vicryl, and skin closure was accomplished with  subcuticular 4-0 Vicryl supported with one-inch Steri-Strips.  The sponge,  needle, and instrument counts were correct.  Estimated blood loss was  negligible.  The patient tolerated the procedure well and was returned to  the recovery room in satisfactory condition.  The patient's family was  informed with regard to the operative findings and procedure performed.           D:  05/31/2014 13:42 PM by Dr. Manson Passey, MD ([id number])  T:  05/31/2014 14:01 PM by Noni Saupe      Everlean Cherry: [id number]) (Doc ID: [id number])

## 2014-05-31 NOTE — Progress Notes (Signed)
RN report received from [facility].  Pt with 4 day hx of vomiting, dizziness, headache, diarrhea.  Pt to ed tonight, little relief with gi cocktail.  Imaging + gallstones, planned procedure in am.  Pt remains NPO at this time.

## 2014-05-31 NOTE — Transfer of Care (Signed)
Anesthesia Transfer of Care Note    Patient: Dana Norman    Procedures performed: Procedure(s):  LAPAROSCOPIC, CHOLECYSTECTOMY    Anesthesia type: General ETT    Patient location:Phase I PACU    Last vitals:   Filed Vitals:    05/31/14 1326   BP: 117/56   Pulse:    Temp: 37 C (98.6 F)   Resp: 16   SpO2: 97%       Post pain: Patient not complaining of pain, continue current therapy      Mental Status:awake    Respiratory Function: tolerating nasal cannula    Cardiovascular: stable    Nausea/Vomiting: patient not complaining of nausea or vomiting    Hydration Status: adequate    Post assessment: no apparent anesthetic complications

## 2014-05-31 NOTE — PACU (Addendum)
Family updated. Pt resting, pain medication given. No resp distress.    1425 Report called to receiving RN.

## 2014-05-31 NOTE — Addendum Note (Signed)
Addendum  created 05/31/14 1517 by Wanita Chamberlain, MD    Modules edited: Anesthesia Flowsheet

## 2014-05-31 NOTE — H&P (Signed)
PEDIATRIC ADMISSION HISTORY AND PHYSICAL EXAM    Date Time: 05/31/2014 6:53 AM  Patient Name: Dana Norman  Attending Physician: Elgie Congo, MD    Patient Active Problem List   Diagnosis   . Labor and delivery indication for care or intervention   . [redacted] weeks gestation of pregnancy   . Active labor   . Acute calculous cholecystitis       Chief complaint: abdominal pain     History of Presenting Illness:   Dana Norman is a 24 y.o. female who was in her usual state of health until 4 days prior to admission when she developed epigastric abdominal pain described as sharp sometimes radiating to lower abdomen while lying down, worse with food, and with associated intermittent NB/NB emesis.  She tried alka-seltzer and it didn't help.  She has been unable to tolerate anything by mouth. No fever.  Few episodes of loose stool.  No chronic NSAID use.  No hematemesis.  No melena or hematochezia.  No sick contacts.  No recent travel.   No previous abdominal pain with fatty meals.  Pregnancy 1 year ago, but not diagnosed with gallstones at that time.      In the ED, labs done.  U/s showed Acute calculous cholecystitis.  Given zosyn and IVF.  Dr. Jed Limerick consulted and to take to the OR for cholecystectomy today.      Past Medical History/Hospitalizations:     Past Medical History   Diagnosis Date   . Asthma without status asthmaticus      albuterol inhaler     Well controlled- no recent exacerbations     Past Surgical History:     Past Surgical History   Procedure Laterality Date   . Cesarean section  04/01/2013     Procedure: CESAREAN SECTION;  Surgeon: Daine Gip, MD;  Location: [facility];  Service: Obstetrics;  Laterality: N/A;         Family History:     Family History   Problem Relation Age of Onset   . No known problems Mother    . No known problems Father    . Asthma Brother        Social History:   Lives with 68year old child and boyfriend.       Allergies:   No Known  Allergies      Medications:     Prescriptions prior to admission   Medication Sig   . Aspirin Effervescent (ALKA-SELTZER PO) Take by mouth.       Review of Systems:   Mild headache.  All ROS negative except for those in HPI     Physical Exam:     Filed Vitals:    05/31/14 0356   BP: 128/78   Pulse: 86   Temp: 98.2 F (36.8 C)   Resp: 18   SpO2: 100%     General: well appearing, well nourished, in no acute distress  HEENT: NC/AT, MMM,   CV: nl s1 s2, RRR, no murmurs.  2+ pulses.  Cap refill <2 sec  Chest: CTA bilaterally.  No wheezes or crackles.  No increased WOB  Abd: soft, obese, TTP epigastric region and RUQ.  No rebound or guarding.    Ext: WWP  Neuro: grossly intact, normal gait     Labs:     Results     Procedure Component Value Units Date/Time    Manual Differential [[id number]]  (Abnormal) Collected:  05/30/14 2148  Segmented Neutrophils 80 % Updated:  05/30/14 2220     Band Neutrophils 0 %      Lymphocytes Manual 16 %      Monocytes Manual 4 %      Eosinophils Manual 0 %      Basophils Manual 0 %      Nucleated RBC 0 /100 WBC      Abs Seg Manual 13.70 (H) x10 3/uL      Bands Absolute 0.00 x10 3/uL      Absolute Lymph Manual 2.74 x10 3/uL      Monocytes Absolute 0.69 x10 3/uL      Absolute Eos Manual 0.00 x10 3/uL      Absolute Baso Manual 0.00 x10 3/uL     Cell MorpHology [[id number]] Collected:  05/30/14 2148     Cell Morphology: Normal Updated:  05/30/14 2220     Platelet Estimate Normal     CBC with differential [[id number]]  (Abnormal) Collected:  05/30/14 2148    Specimen Information:  Blood / Blood Updated:  05/30/14 2220     WBC 17.13 (H) x10 3/uL      Hgb 13.0 g/dL      Hematocrit 31.5 %      Platelets 311 x10 3/uL      RBC 4.64 x10 6/uL      MCV 83.0 fL      MCH 28.0 pg      MCHC 33.8 g/dL      RDW 14 %      MPV 9.9 fL     Comprehensive metabolic panel [[id number]]  (Abnormal) Collected:  05/30/14 2148    Specimen Information:  Blood Updated:  05/30/14 2218     Glucose 106 (H) mg/dL      BUN 9.9  mg/dL      Creatinine 0.7 mg/dL      Sodium 106 mEq/L      Potassium 4.1 mEq/L      Chloride 105 mEq/L      CO2 20 (L) mEq/L      CALCIUM 8.9 mg/dL      Protein, Total 7.6 g/dL      Albumin 3.7 g/dL      AST (SGOT) 16 U/L      ALT 16 U/L      Alkaline Phosphatase 86 U/L      Bilirubin, Total 0.3 mg/dL      Globulin 3.9 (H) g/dL      Albumin/Globulin Ratio 0.9      Anion Gap 12.0     Lipase [[id number]] Collected:  05/30/14 2148    Specimen Information:  Blood Updated:  05/30/14 2218     Lipase 12 U/L     Amylase [[id number]] Collected:  05/30/14 2148    Specimen Information:  Blood Updated:  05/30/14 2218     Amylase 52 U/L     GFR [[id number]] Collected:  05/30/14 2148     EGFR >60.0 Updated:  05/30/14 2218            Rads:     Radiology Results (24 Hour)     Procedure Component Value Units Date/Time    US Abdomen Limited Ruq [[id number]] Collected:  05/31/14 0155    Order Status:  Completed Updated:  05/31/14 0207    Narrative:      History:  Epigastric abdominal pain and vomiting.    Sonographic examination of the right upper quadrant of the abdomen was  performed. The gallbladder is distended. There is  diffuse moderate  gallbladder wall thickening. There is a small amount of pericholecystic  fluid. Several layering subcentimeter calcified gallstones are  demonstrated in the fundal gallbladder lumen. Assessment for sonographic  Eulah Pont sign is limited due to patient analgesia. The visualized bile  ducts are nondilated. The common bile duct measures 4 mm in diameter. No  choledocholithiasis is demonstrated.    The liver parenchyma is diffusely mildly echogenic with minimal  posterior acoustic attenuation, in keeping with mild diffuse hepatic  steatosis. No liver mass is detected, noting decreased sensitivity in  the setting of an echogenic liver. Liver surface appears smooth. The  limited visualized portions of the pancreas appear normal.    No aneurysmal dilation of the abdominal aorta is demonstrated. The  visualized  portions of the IVC are unremarkable.  There is no right  upper quadrant ascites. Limited evaluation of the right kidney  demonstrates normal size (right renal length 12.0 cm) and no right  hydronephrosis.      Impression:          1. Distended and thick walled gallbladder with cholelithiasis and  pericholecystic fluid. These findings are suspicious for acute calculus  cholecystitis, although sonographic evaluation is limited by patient  analgesia and clinical correlation is necessary.    2. No biliary ductal dilatation.    3. Mild diffuse hepatic steatosis.    Cleone Slim, MD   05/31/2014 2:03 AM            Assessment:   24 year old female with acute calculous cholecystitis     Plan:   NPO w/ IVF.  OR today for lap cholecystectomy with Surgery.  Zosyn.  Morphine prn pain.  ? Home later today.      Signed by: Elgie Congo      Total time spent in eval/mgmt: 50 min  Time in Counseling/coord care: was greater than 50%  Counseling/Coord details:  Discussed with pt

## 2014-05-31 NOTE — H&P (Signed)
Pt. Examined on this date.  No change in the History and Physical Exam.    Erick Colace Lawson Radar., MD  05/31/2014  8:42 AM

## 2014-05-31 NOTE — Anesthesia Preprocedure Evaluation (Signed)
Anesthesia Evaluation    AIRWAY    Mallampati: II    TM distance: >3 FB  Neck ROM: full  Mouth Opening:full   CARDIOVASCULAR    cardiovascular exam normal       DENTAL    no notable dental hx     PULMONARY    pulmonary exam normal     OTHER FINDINGS                      Anesthesia Plan    ASA 2     general                     intravenous induction         Post Op: other  Post op pain management: per surgeon    informed consent obtained      pertinent labs reviewed             ===============================================================  Inpatient Anesthesia Evaluation    Patient Name: Dana Norman, Dana Norman  Surgeon: Buelah Manis., MD  Patient Age / Sex: 24 y.o. / female    Medical History:     Past Medical History   Diagnosis Date   . Asthma without status asthmaticus      albuterol inhaler       Past Surgical History   Procedure Laterality Date   . Cesarean section  04/01/2013     Procedure: CESAREAN SECTION;  Surgeon: Daine [facility]ip, MD;  Location: [facility];  Service: Obstetrics;  Laterality: N/A;         Allergies:   No Known Allergies      Medications:     Current Facility-Administered Medications   Medication Dose Route Frequency Last Rate Last Dose   . dextrose  5 % and 0.45 % NaCl infusion   Intravenous Continuous 125 mL/hr at 05/31/14 0356     . [MAR Hold] influenza quadrivalent-split vaccine (PF) (FLUARIX/FLULAVAL/FLUZONE) IM injection 0.5 mL  0.5 mL Intramuscular Prior to discharge       . lactated ringers infusion   Intravenous Continuous 20 mL/hr at 05/31/14 0859     . [MAR Hold] morphine injection 4 mg  4 mg Intravenous Q3H PRN       . [MAR Hold] ondansetron (ZOFRAN) injection 4 mg  4 mg Intravenous Q6H PRN       . [MAR Hold] piperacillin-tazobactam (ZOSYN) 4.5 g in sodium chloride 0.9 % 100 mL IVPB mini-bag plus  4.5 g Intravenous Q8H SCH 200 mL/hr at 05/31/14 0701 4.5 g at 05/31/14 0701              Prior to Admission medications    Medication Sig Start Date End Date  Taking? Authorizing Provider   Aspirin Effervescent (ALKA-SELTZER PO) Take by mouth.   Yes [provider]     Vitals   Temp:  [36.3 C (97.3 F)-36.9 C (98.5 F)] 36.3 C (97.3 F)  Heart Rate:  [74-100] 85  Resp Rate:  [16-20] 18  BP: (111-140)/(57-78) 140/72 mmHg    Wt Readings from Last 3 Encounters:   05/31/14 106.142 kg (234 lb)   03/31/13 98.884 kg (218 lb)     BMI (Estimated body mass index is 42.79 kg/(m^2) as calculated from the following:    Height as of this encounter: 1.575 m (5\' 2" ).    Weight as of this encounter: 106.142 kg (234 lb).)  Temp Readings from Last 3 Encounters:   05/31/14  36.3 C (97.3 F) Temporal Artery   04/04/13 36.1 C (97 F)      BP Readings from Last 3 Encounters:   05/31/14 140/72   04/04/13 113/57     Pulse Readings from Last 3 Encounters:   05/31/14 85   04/04/13 87           Labs:   CBC:  Lab Results   Component Value Date    WBC 17.13* 05/30/2014    HGB 13.0 05/30/2014    HCT 38.5 05/30/2014    PLT 311 05/30/2014       Chemistries:  Lab Results   Component Value Date    NA 137 05/30/2014    K 4.1 05/30/2014    CL 105 05/30/2014    CO2 20* 05/30/2014    BUN 9.9 05/30/2014    CREAT 0.7 05/30/2014    GLU 106* 05/30/2014    CA 8.9 05/30/2014    AST 16 05/30/2014     _____________________      Signed by: Wanita Chamberlain  05/31/2014   9:51 AM    =============================================================

## 2014-06-01 ENCOUNTER — Encounter: Payer: Self-pay | Admitting: Surgery

## 2014-06-01 LAB — COMPREHENSIVE METABOLIC PANEL
ALT: 27 U/L (ref 0–55)
AST (SGOT): 27 U/L (ref 5–34)
Albumin/Globulin Ratio: 0.9 (ref 0.9–2.2)
Albumin: 3 g/dL — ABNORMAL LOW (ref 3.5–5.0)
Alkaline Phosphatase: 71 U/L (ref 37–106)
Anion Gap: 10 (ref 5.0–15.0)
BUN: 4.7 mg/dL — ABNORMAL LOW (ref 7.0–19.0)
Bilirubin, Total: 0.4 mg/dL (ref 0.2–1.2)
CO2: 20 mEq/L — ABNORMAL LOW (ref 22–29)
Calcium: 8.1 mg/dL — ABNORMAL LOW (ref 8.5–10.5)
Chloride: 108 mEq/L (ref 100–111)
Creatinine: 0.7 mg/dL (ref 0.6–1.0)
Globulin: 3.4 g/dL (ref 2.0–3.6)
Glucose: 149 mg/dL — ABNORMAL HIGH (ref 70–100)
Potassium: 3.9 mEq/L (ref 3.5–5.1)
Protein, Total: 6.4 g/dL (ref 6.0–8.3)
Sodium: 138 mEq/L (ref 136–145)

## 2014-06-01 LAB — CBC AND DIFFERENTIAL
Basophils Absolute Automated: 0 10*3/uL (ref 0.00–0.20)
Basophils Automated: 0 %
Eosinophils Absolute Automated: 0 10*3/uL (ref 0.00–0.70)
Eosinophils Automated: 0 %
Hematocrit: 35 % — ABNORMAL LOW (ref 37.0–47.0)
Hgb: 11.3 g/dL — ABNORMAL LOW (ref 12.0–16.0)
Immature Granulocytes Absolute: 0.03 10*3/uL
Immature Granulocytes: 0 %
Lymphocytes Absolute Automated: 1.88 10*3/uL (ref 0.50–4.40)
Lymphocytes Automated: 14 %
MCH: 27.2 pg — ABNORMAL LOW (ref 28.0–32.0)
MCHC: 32.3 g/dL (ref 32.0–36.0)
MCV: 84.1 fL (ref 80.0–100.0)
MPV: 9.6 fL (ref 9.4–12.3)
Monocytes Absolute Automated: 0.44 10*3/uL (ref 0.00–1.20)
Monocytes: 3 %
Neutrophils Absolute: 10.67 10*3/uL — ABNORMAL HIGH (ref 1.80–8.10)
Neutrophils: 82 %
Platelets: 259 10*3/uL (ref 140–400)
RBC: 4.16 10*6/uL — ABNORMAL LOW (ref 4.20–5.40)
RDW: 13 % (ref 12–15)
WBC: 12.99 10*3/uL — ABNORMAL HIGH (ref 3.50–10.80)

## 2014-06-01 LAB — GFR: EGFR: >60

## 2014-06-01 MED ORDER — OXYCODONE-ACETAMINOPHEN 5-325 MG PO TABS
2.0000 | ORAL_TABLET | ORAL | Status: DC | PRN
Start: 2014-06-01 — End: 2017-08-03

## 2014-06-01 NOTE — Discharge Instructions (Signed)
Cholecystectomy  You've had painful attacks caused by gallstones. To treat the problem, your doctor wants to remove your gallbladder. This surgery is called cholecystectomy. Removing the gallbladder can relieve pain. It will also prevent future attacks. You can live a healthy life without your gallbladder. You may also be able to go back to eating foods you enjoyed before your gallbladder problems started.    Before Your Surgery   Tell your provider what medications you take. Include those bought over the counter. Also include herbs or supplements. Be sure to mention if you take prescription blood thinners. This includes warfarin, clopidogrel, and aspirin.   Have any tests your provider asks for, such as blood tests.   Don't eat or drink after midnight, the night before your surgery. This includes water, coffee, and mints. However, youmay need to take some medication with sips of water -- consult your doctor.  The Day of Surgery  When you arrive, you will prepare for surgery:   An IV line will be put into a vein in your arm or hand. This gives you fluids and medication.   An anesthesiologist will talk with you about anesthesia. This is medication used to prevent pain. You will receive general anesthesia. This puts you into a state like deep sleep through the procedure.  During Surgery  There are2 methods for removing the gallbladder. Your doctor will choose which method isbest for you:   Laparoscopic cholecystectomy. This is most common. During surgery, 2 to 4 small incisions are made. A thin tube with a camera is used. This is called a laparoscope. The scope is put through one of the incisions. It sends images to a video screen. Surgical tools are put through other incisions. The gallbladder is removed using the scope and these tools.   Open cholecystectomy. One larger incision is made. The surgeon sees and works through this incision. Open surgery is most often used when scarring or other factors make  it a better choice for you.  In some cases, safety requires a change from laparoscopic to open surgery during the procedure.  After Surgery  You will be sent to a room to wake up from the anesthesia. You will likely go home the same day. In some cases, an overnight stay is needed. If you had open cholecystectomy, you may need to stay in the hospital for a few days.When you are released to go home, have a family member or friend ready to drive you.     2000-2015 [employer] [address], [place] [address]. All rights reserved. This information is not intended as a substitute for professional medical care. Always follow your healthcare professional's instructions.

## 2014-06-01 NOTE — Plan of Care (Signed)
Problem: Pain  Goal: Patient's pain/discomfort is manageable  Intervention: Include patient/family/caregiver in decisions related to pain management  Pt requested pain medication prior to discharge. Medication given.

## 2014-06-01 NOTE — Progress Notes (Signed)
Reviewed discharge instructions and discharge medications with patient and patient voiced understanding. Pt dressed and awaiting ride home at this time. Pt left floor at 1145 via w/c by tech with family at side by

## 2014-06-01 NOTE — Plan of Care (Signed)
Problem: Pain  Goal: Patient's pain/discomfort is manageable  Outcome: Progressing  Patient resting in bed in NAD, patient voices understanding of pain control and agrees with plan for medication if needed.

## 2014-06-01 NOTE — Final Progress Note (DC Note for stay less than 48 (Signed)
PEDS DISCHARGE NOTE    Date Time: 06/01/2014 7:05 AM  Patient Name: Dana Norman, Dana Norman  24 y.o., female  Problem list:     Patient Active Problem List   Diagnosis   . Labor and delivery indication for care or intervention   . [redacted] weeks gestation of pregnancy   . Active labor   . Acute calculous cholecystitis       24 year old female with acute calculous cholecystitis.  Admitted and started on Zosyn pre-op.  Morphine prn pain.  She was taken to the OR yesterday for lap cholecystectomy w/ Dr. Ronald Lobo which she tolerated well with no complications.  Post-op pain treated with Percocet.  She is tolerating PO and stable for discharge today-discussed with Dr. Thurnell Lose to discharge.     Assessment:   24 year old female with acute calculous cholecystitis POD 1 s/p lap cholecystectomy.  Stable for discharge     Plan:   D/c home on percocet 1-2 tabs po q4 hours prn pain.  Ambulation TID.  Low fat diet.      F/u Dr. Ronald Lobo in 2 weeks         Subjective:   No acute events overnight.  Tolerating PO.  Pain controlled.     Physical Exam:     Filed Vitals:    06/01/14 0348   BP: 102/67   Pulse: 88   Temp: 98.1 F (36.7 C)   Resp: 16   SpO2: 97%     .  Intake and Output Summary (Last 24 hours) at Date Time    Intake/Output Summary (Last 24 hours) at 06/01/14 0705  Last data filed at 06/01/14 0348   Gross per 24 hour   Intake   3477 ml   Output   2020 ml   Net   1457 ml     General:  Well appearing, no acute distress  Skin: no rashes  HEENT: MMM  CV: nl s1 s2 RRR, no murmurs, cap refill <2 sec, 2+ pulses  Chest: CTA bilaterally, no increased work of breathing  Abd: soft, obese, appropriately TTP.  Incisions C/D/I   Ext: WWP   Neuro: grossly intact, no focal deficits     Labs:   Results for Dana Norman, Dana Norman (MRN [id number]) as of 06/01/2014 07:08   Ref. Range 05/30/2014 21:48 05/31/2014 01:48 06/01/2014 04:13   WBC Latest Ref Range: 3.50-10.80 x10 3/uL 17.13 (H)  12.99 (H)   Hemoglobin Latest Ref Range: 12.0-16.0 g/dL 46.9  62.9 (L)    Hematocrit Latest Ref Range: 37.0-47.0 % 38.5  35.0 (L)   Platelet Count Latest Ref Range: 140-400 x10 3/uL 311  259   RBC Latest Ref Range: 4.20-5.40 x10 6/uL 4.64  4.16 (L)   MCV Latest Ref Range: 80.0-100.0 fL 83.0  84.1   MCH, POC Latest Ref Range: 28.0-32.0 pg 28.0  27.2 (L)   MCHC Latest Ref Range: 32.0-36.0 g/dL 52.8  41.3   RDW Latest Ref Range: 12-15 % 14  13   MPV Latest Ref Range: 9.4-12.3 fL 9.9  9.6     Results for Dana Norman, Dana Norman (MRN [id number]) as of 06/01/2014 07:08   Ref. Range 05/30/2014 21:48 06/01/2014 04:13   Glucose Latest Ref Range: 70-100 mg/dL 253 (H) 664 (H)   BUN Latest Ref Range: 7.0-19.0 mg/dL 9.9 4.7 (L)   Creatinine Latest Ref Range: 0.6-1.0 mg/dL 0.7 0.7   Sodium Latest Ref Range: 136-145 mEq/L 137 138   Potassium Latest Ref Range: 3.5-5.1 mEq/L 4.1  3.9   Chloride Latest Ref Range: 100-111 mEq/L 105 108   CO2 Latest Ref Range: 22-29 mEq/L 20 (L) 20 (L)   Anion Gap Latest Ref Range: 5.0-15.0  12.0 10.0   Calcium Latest Ref Range: 8.5-10.5 mg/dL 8.9 8.1 (L)   EGFR Unknown >60.0 >60.0   AST (SGOT) Latest Ref Range: 5-34 U/L 16 27   ALT Latest Ref Range: 0-55 U/L 16 27   Alkaline Phosphatase Latest Ref Range: 37-106 U/L 86 71   Albumin Latest Ref Range: 3.5-5.0 g/dL 3.7 3.0 (L)   Protein, Total Latest Ref Range: 6.0-8.3 g/dL 7.6 6.4   Globulin Latest Ref Range: 2.0-3.6 g/dL 3.9 (H) 3.4   Albumin/Globulin Ratio Latest Ref Range: 0.9-2.2  0.9 0.9   Bilirubin, Total Latest Ref Range: 0.2-1.2 mg/dL 0.3 0.4         Rads:     Radiology Results (24 Hour)     ** No results found for the last 24 hours. **              Signed by: Elgie Congo

## 2014-06-02 ENCOUNTER — Encounter: Payer: Self-pay | Admitting: Surgery

## 2014-06-03 LAB — LAB USE ONLY - HISTORICAL SURGICAL PATHOLOGY

## 2019-02-28 ENCOUNTER — Encounter (HOSPITAL_COMMUNITY): Payer: Self-pay | Admitting: *Deleted

## 2019-02-28 ENCOUNTER — Other Ambulatory Visit: Payer: Self-pay

## 2019-02-28 ENCOUNTER — Inpatient Hospital Stay (HOSPITAL_COMMUNITY)
Admission: EM | Admit: 2019-02-28 | Discharge: 2019-02-28 | Disposition: A | Discharge status: Home / Self Care | Payer: Medicaid Other | Attending: Obstetrics and Gynecology | Admitting: Obstetrics and Gynecology

## 2019-02-28 DIAGNOSIS — O209 Hemorrhage in early pregnancy, unspecified: Secondary | ICD-10-CM

## 2019-02-28 DIAGNOSIS — J45909 Unspecified asthma, uncomplicated: Secondary | ICD-10-CM | POA: Insufficient documentation

## 2019-02-28 DIAGNOSIS — A5901 Trichomonal vulvovaginitis: Secondary | ICD-10-CM | POA: Diagnosis not present

## 2019-02-28 DIAGNOSIS — O26891 Other specified pregnancy related conditions, first trimester: Secondary | ICD-10-CM

## 2019-02-28 DIAGNOSIS — M545 Low back pain: Secondary | ICD-10-CM | POA: Diagnosis not present

## 2019-02-28 DIAGNOSIS — O99511 Diseases of the respiratory system complicating pregnancy, first trimester: Secondary | ICD-10-CM | POA: Diagnosis not present

## 2019-02-28 DIAGNOSIS — R0789 Other chest pain: Secondary | ICD-10-CM | POA: Diagnosis not present

## 2019-02-28 DIAGNOSIS — O98311 Other infections with a predominantly sexual mode of transmission complicating pregnancy, first trimester: Secondary | ICD-10-CM | POA: Diagnosis not present

## 2019-02-28 DIAGNOSIS — O26899 Other specified pregnancy related conditions, unspecified trimester: Secondary | ICD-10-CM

## 2019-02-28 DIAGNOSIS — R109 Unspecified abdominal pain: Secondary | ICD-10-CM

## 2019-02-28 DIAGNOSIS — Z3A12 12 weeks gestation of pregnancy: Secondary | ICD-10-CM | POA: Diagnosis not present

## 2019-02-28 DIAGNOSIS — R1030 Lower abdominal pain, unspecified: Secondary | ICD-10-CM | POA: Insufficient documentation

## 2019-02-28 HISTORY — DX: Unspecified asthma, uncomplicated: J45.909

## 2019-02-28 LAB — CBC
HCT: 36.2 % (ref 36.0–46.0)
Hemoglobin: 12.2 g/dL (ref 12.0–15.0)
MCH: 29.2 pg (ref 26.0–34.0)
MCHC: 33.7 g/dL (ref 30.0–36.0)
MCV: 86.6 fL (ref 80.0–100.0)
Platelets: 303 10*3/uL (ref 150–400)
RBC: 4.18 MIL/uL (ref 3.87–5.11)
RDW: 13.2 % (ref 11.5–15.5)
WBC: 13.8 10*3/uL — ABNORMAL HIGH (ref 4.0–10.5)
nRBC: 0 % (ref 0.0–0.2)

## 2019-02-28 LAB — URINALYSIS, ROUTINE W REFLEX MICROSCOPIC
Bilirubin Urine: NEGATIVE
Glucose, UA: NEGATIVE mg/dL
Ketones, ur: NEGATIVE mg/dL
Nitrite: NEGATIVE
Protein, ur: NEGATIVE mg/dL
Specific Gravity, Urine: 1.006 (ref 1.005–1.030)
pH: 7 (ref 5.0–8.0)

## 2019-02-28 LAB — ABO/RH: ABO/RH(D): O POS

## 2019-02-28 LAB — WET PREP, GENITAL
Sperm: NONE SEEN
Yeast Wet Prep HPF POC: NONE SEEN

## 2019-02-28 LAB — POCT PREGNANCY, URINE: Preg Test, Ur: POSITIVE — AB

## 2019-02-28 MED ORDER — ALBUTEROL SULFATE (2.5 MG/3ML) 0.083% IN NEBU: 3.0000 mL | INHALATION_SOLUTION | Freq: Once | RESPIRATORY_TRACT | Status: DC

## 2019-02-28 MED ORDER — ALBUTEROL SULFATE HFA 108 (90 BASE) MCG/ACT IN AERS: 2.0000 | INHALATION_SPRAY | Freq: Four times a day (QID) | RESPIRATORY_TRACT | 0 refills | Status: AC | PRN

## 2019-02-28 MED ORDER — ALBUTEROL SULFATE HFA 108 (90 BASE) MCG/ACT IN AERS
2.0000 | INHALATION_SPRAY | Freq: Once | RESPIRATORY_TRACT | Status: AC
  Administered 2019-02-28: 2 via RESPIRATORY_TRACT
  Filled 2019-02-28: qty 6.7

## 2019-02-28 MED ORDER — METRONIDAZOLE 500 MG PO TABS
2000.0000 mg | ORAL_TABLET | Freq: Once | ORAL | Status: AC
  Administered 2019-02-28: 2000 mg via ORAL
  Filled 2019-02-28: qty 4

## 2019-02-28 NOTE — ED Triage Notes (Signed)
Pt complains of abdominal pain and back pain.  [redacted] weeks pregnant.  [facility] RN approved transport.

## 2019-02-28 NOTE — ED Notes (Signed)
Called patient to be triaged x3 and had no answer. 

## 2019-02-28 NOTE — MAU Provider Note (Signed)
Chief Complaint: Abdominal Cramping and Back Pain   First Provider Initiated Contact with Patient 02/28/19 2057        SUBJECTIVE HPI: Ruth Perry is a 28 y.o. G2P1001 at 852w0d by LMP who presents to maternity admissions reporting lower abdominal and lower back pain for two days.  Had some pink spotting also.  Also states her chest is somewhat tight/hard to catch her breath sometimes.  Had COVID in September but states did well. . She denies vaginal itching/burning, urinary symptoms, h/a, dizziness, n/v, or fever/chills.    Abdominal Cramping This is a new problem. The current episode started in the past 7 days. The onset quality is gradual. The problem occurs intermittently. The problem has been unchanged. The pain is mild. The quality of the pain is cramping. Pertinent negatives include no constipation, diarrhea, dysuria, fever, frequency, myalgias, nausea or vomiting. Nothing aggravates the pain. The pain is relieved by nothing. She has tried nothing for the symptoms.  Vaginal Bleeding The patient's primary symptoms include pelvic pain and vaginal bleeding. The patient's pertinent negatives include no genital itching, genital lesions or genital odor. This is a new problem. The current episode started in the past 7 days. The problem occurs intermittently. The problem has been unchanged. The pain is mild. She is pregnant. Pertinent negatives include no chills, constipation, diarrhea, dysuria, fever, frequency, nausea or vomiting. The vaginal discharge was bloody. The vaginal bleeding is spotting. She has not been passing clots. She has not been passing tissue. Nothing aggravates the symptoms. She has tried nothing for the symptoms.    RN note: Abdominal pain and lower back pain for couple days. Pink spotting. Sometimes is hard to catch my breath. Hx asthma as child. LMP 12/06/18  No past medical history on file.  Social History   Socioeconomic History  . Marital status: Single    Spouse  name: Not on file  . Number of children: Not on file  . Years of education: Not on file  . Highest education level: Not on file  Occupational History  . Not on file  Social Needs  . Financial resource strain: Not on file  . Food insecurity    Worry: Not on file    Inability: Not on file  . Transportation needs    Medical: Not on file    Non-medical: Not on file  Tobacco Use  . Smoking status: Not on file  Substance and Sexual Activity  . Alcohol use: Not on file  . Drug use: Not on file  . Sexual activity: Not on file  Lifestyle  . Physical activity    Days per week: Not on file    Minutes per session: Not on file  . Stress: Not on file  Relationships  . Social Musicianconnections    Talks on phone: Not on file    Gets together: Not on file    Attends religious service: Not on file    Active member of club or organization: Not on file    Attends meetings of clubs or organizations: Not on file    Relationship status: Not on file  . Intimate partner violence    Fear of current or ex partner: Not on file    Emotionally abused: Not on file    Physically abused: Not on file    Forced sexual activity: Not on file  Other Topics Concern  . Not on file  Social History Narrative  . Not on file   No current facility-administered medications  on file prior to encounter.    No current outpatient medications on file prior to encounter.   Not on File  I have reviewed patient's Past Medical Hx, Surgical Hx, Family Hx, Social Hx, medications and allergies.   ROS:  Review of Systems  Constitutional: Negative for chills and fever.  Respiratory: Positive for chest tightness. Negative for shortness of breath and wheezing.   Gastrointestinal: Negative for constipation, diarrhea, nausea and vomiting.  Genitourinary: Positive for pelvic pain and vaginal bleeding. Negative for dysuria and frequency.  Musculoskeletal: Negative for myalgias.   Review of Systems  Other systems  negative   Physical Exam  Physical Exam Patient Vitals for the past 24 hrs:  BP Temp Temp src Pulse Resp SpO2 Height Weight  02/28/19 2042 136/78 98.3 F (36.8 C) - 81 18 100 % 5\' 2"  (1.575 m) 101.2 kg  02/28/19 1957 133/75 98.8 F (37.1 C) Oral 82 17 100 % - -   Constitutional: Well-developed, well-nourished female in no acute distress.  Cardiovascular: normal rate Respiratory: normal effort GI: Abd soft, non-tender. Pos BS x 4 MS: Extremities nontender, no edema, normal ROM Neurologic: Alert and oriented x 4.  GU: Neg CVAT.  PELVIC EXAM: Cervix pink, visually closed, without lesion, scant white creamy discharge, vaginal walls and external genitalia normal  No blood visible Bimanual exam: Cervix 0/long/high, firm, anterior, neg CMT, uterus nontender, enlarged 10 wk size, adnexa without tenderness, enlargement, or mass  FHT 159 by doppler  LAB RESULTS Results for orders placed or performed during the hospital encounter of 02/28/19 (from the past 24 hour(s))  Pregnancy, urine POC     Status: Abnormal   Collection Time: 02/28/19  8:43 PM  Result Value Ref Range   Preg Test, Ur POSITIVE (A) NEGATIVE  Urinalysis, Routine w reflex microscopic     Status: Abnormal   Collection Time: 02/28/19  8:50 PM  Result Value Ref Range   Color, Urine YELLOW YELLOW   APPearance HAZY (A) CLEAR   Specific Gravity, Urine 1.006 1.005 - 1.030   pH 7.0 5.0 - 8.0   Glucose, UA NEGATIVE NEGATIVE mg/dL   Hgb urine dipstick SMALL (A) NEGATIVE   Bilirubin Urine NEGATIVE NEGATIVE   Ketones, ur NEGATIVE NEGATIVE mg/dL   Protein, ur NEGATIVE NEGATIVE mg/dL   Nitrite NEGATIVE NEGATIVE   Leukocytes,Ua LARGE (A) NEGATIVE   RBC / HPF 6-10 0 - 5 RBC/hpf   WBC, UA 21-50 0 - 5 WBC/hpf   Bacteria, UA RARE (A) NONE SEEN   Squamous Epithelial / LPF 11-20 0 - 5  Wet prep, genital     Status: Abnormal   Collection Time: 02/28/19  9:43 PM   Specimen: Vaginal  Result Value Ref Range   Yeast Wet Prep HPF POC  NONE SEEN NONE SEEN   Trich, Wet Prep PRESENT (A) NONE SEEN   Clue Cells Wet Prep HPF POC PRESENT (A) NONE SEEN   WBC, Wet Prep HPF POC MANY (A) NONE SEEN   Sperm NONE SEEN   ABO/Rh     Status: None   Collection Time: 02/28/19  9:49 PM  Result Value Ref Range   ABO/RH(D) O POS    No rh immune globuloin      NOT A RH IMMUNE GLOBULIN CANDIDATE, PT RH POSITIVE Performed at [facility], [address] [place], [place]   CBC     Status: Abnormal   Collection Time: 02/28/19  9:49 PM  Result Value Ref Range  WBC 13.8 (H) 4.0 - 10.5 K/uL   RBC 4.18 3.87 - 5.11 MIL/uL   Hemoglobin 12.2 12.0 - 15.0 g/dL   HCT 36.2 36.0 - 46.0 %   MCV 86.6 80.0 - 100.0 fL   MCH 29.2 26.0 - 34.0 pg   MCHC 33.7 30.0 - 36.0 g/dL   RDW 13.2 11.5 - 15.5 %   Platelets 303 150 - 400 K/uL   nRBC 0.0 0.0 - 0.2 %       IMAGING No results found.  MAU Management/MDM: Albuterol inhaler given (unable to do nebulizer).   Did get some relief from using inhaler Reviewed labs results and trichomonas This is likely responsible for bleeding and cramping Treated with Metronidazole 2gm here  Discussed partner needs treatment Abstain until he is treated List of providers given for prenatal care  ASSESSMENT Single intrauterine pregnancy at [redacted]w[redacted]d Bleeding in pregnancy likely due to trichomonas infections Trichomonas vaginitis Asthma chest tightness  PLAN Discharge home Rx albuterol inhaler for prn use for wheezing Encouraged to seek prenatal care   Pt stable at time of discharge. Encouraged to return here or to other Urgent Care/ED if she develops worsening of symptoms, increase in pain, fever, or other concerning symptoms.    Hansel Feinstein CNM, MSN Certified Nurse-Midwife 02/28/2019  8:57 PM

## 2019-02-28 NOTE — Discharge Instructions (Signed)
First Trimester of Pregnancy ° °The first trimester of pregnancy is from week 1 until the end of week 13 (months 1 through 3). During this time, your baby will begin to develop inside you. At 6-8 weeks, the eyes and face are formed, and the heartbeat can be seen on ultrasound. At the end of 12 weeks, all the baby's organs are formed. Prenatal care is all the medical care you receive before the birth of your baby. Make sure you get good prenatal care and follow all of your doctor's instructions. °Follow these instructions at home: °Medicines °· Take over-the-counter and prescription medicines only as told by your doctor. Some medicines are safe and some medicines are not safe during pregnancy. °· Take a prenatal vitamin that contains at least 600 micrograms (mcg) of folic acid. °· If you have trouble pooping (constipation), take medicine that will make your stool soft (stool softener) if your doctor approves. °Eating and drinking ° °· Eat regular, healthy meals. °· Your doctor will tell you the amount of weight gain that is right for you. °· Avoid raw meat and uncooked cheese. °· If you feel sick to your stomach (nauseous) or throw up (vomit): °? Eat 4 or 5 small meals a day instead of 3 large meals. °? Try eating a few soda crackers. °? Drink liquids between meals instead of during meals. °· To prevent constipation: °? Eat foods that are high in fiber, like fresh fruits and vegetables, whole grains, and beans. °? Drink enough fluids to keep your pee (urine) clear or pale yellow. °Activity °· Exercise only as told by your doctor. Stop exercising if you have cramps or pain in your lower belly (abdomen) or low back. °· Do not exercise if it is too hot, too humid, or if you are in a place of great height (high altitude). °· Try to avoid standing for long periods of time. Move your legs often if you must stand in one place for a long time. °· Avoid heavy lifting. °· Wear low-heeled shoes. Sit and stand up  straight. °· You can have sex unless your doctor tells you not to. °Relieving pain and discomfort °· Wear a good support bra if your breasts are sore. °· Take warm water baths (sitz baths) to soothe pain or discomfort caused by hemorrhoids. Use hemorrhoid cream if your doctor says it is okay. °· Rest with your legs raised if you have leg cramps or low back pain. °· If you have puffy, bulging veins (varicose veins) in your legs: °? Wear support hose or compression stockings as told by your doctor. °? Raise (elevate) your feet for 15 minutes, 3-4 times a day. °? Limit salt in your food. °Prenatal care °· Schedule your prenatal visits by the twelfth week of pregnancy. °· Write down your questions. Take them to your prenatal visits. °· Keep all your prenatal visits as told by your doctor. This is important. °Safety °· Wear your seat belt at all times when driving. °· Make a list of emergency phone numbers. The list should include numbers for family, friends, the hospital, and police and fire departments. °General instructions °· Ask your doctor for a referral to a local prenatal class. Begin classes no later than at the start of month 6 of your pregnancy. °· Ask for help if you need counseling or if you need help with nutrition. Your doctor can give you advice or tell you where to go for help. °· Do not use hot tubs, steam   rooms, or saunas.  Do not douche or use tampons or scented sanitary pads.  Do not cross your legs for long periods of time.  Avoid all herbs and alcohol. Avoid drugs that are not approved by your doctor.  Do not use any tobacco products, including cigarettes, chewing tobacco, and electronic cigarettes. If you need help quitting, ask your doctor. You may get counseling or other support to help you quit.  Avoid cat litter boxes and soil used by cats. These carry germs that can cause birth defects in the baby and can cause a loss of your baby (miscarriage) or stillbirth.  Visit your dentist.  At home, brush your teeth with a soft toothbrush. Be gentle when you floss. Contact a doctor if:  You are dizzy.  You have mild cramps or pressure in your lower belly.  You have a nagging pain in your belly area.  You continue to feel sick to your stomach, you throw up, or you have watery poop (diarrhea).  You have a bad smelling fluid coming from your vagina.  You have pain when you pee (urinate).  You have increased puffiness (swelling) in your face, hands, legs, or ankles. Get help right away if:  You have a fever.  You are leaking fluid from your vagina.  You have spotting or bleeding from your vagina.  You have very bad belly cramping or pain.  You gain or lose weight rapidly.  You throw up blood. It may look like coffee grounds.  You are around people who have [place] measles, fifth disease, or chickenpox.  You have a very bad headache.  You have shortness of breath.  You have any kind of trauma, such as from a fall or a car accident. Summary  The first trimester of pregnancy is from week 1 until the end of week 13 (months 1 through 3).  To take care of yourself and your unborn baby, you will need to eat healthy meals, take medicines only if your doctor tells you to do so, and do activities that are safe for you and your baby.  Keep all follow-up visits as told by your doctor. This is important as your doctor will have to ensure that your baby is healthy and growing well. This information is not intended to replace advice given to you by your health care provider. Make sure you discuss any questions you have with your health care provider. Document Released: 09/21/2007 Document Revised: 07/26/2018 Document Reviewed: 04/12/2016 Elsevier Patient Education  2020 Else[employer] Inc. Asthma, Adult  Asthma is a long-term (chronic) condition in which the airways get tight and narrow. The airways are the breathing passages that lead from the nose and mouth down into the  lungs. A person with asthma will have times when symptoms get worse. These are called asthma attacks. They can cause coughing, whistling sounds when you breathe (wheezing), shortness of breath, and chest pain. They can make it hard to breathe. There is no cure for asthma, but medicines and lifestyle changes can help control it. There are many things that can bring on an asthma attack or make asthma symptoms worse (triggers). Common triggers include:  Mold.  Dust.  Cigarette smoke.  Cockroaches.  Things that can cause allergy symptoms (allergens). These include animal skin flakes (dander) and pollen from trees or grass.  Things that pollute the air. These may include household cleaners, wood smoke, smog, or chemical odors.  Cold air, weather changes, and wind.  Crying or laughing hard.  Stress.  Certain medicines or drugs.  Certain foods such as dried fruit, potato chips, and grape juice.  Infections, such as a cold or the flu.  Certain medical conditions or diseases.  Exercise or tiring activities. Asthma may be treated with medicines and by staying away from the things that cause asthma attacks. Types of medicines may include:  Controller medicines. These help prevent asthma symptoms. They are usually taken every day.  Fast-acting reliever or rescue medicines. These quickly relieve asthma symptoms. They are used as needed and provide short-term relief.  Allergy medicines if your attacks are brought on by allergens.  Medicines to help control the body's defense (immune) system. Follow these instructions at home: Avoiding triggers in your home  Change your heating and air conditioning filter often.  Limit your use of fireplaces and wood stoves.  Get rid of pests (such as roaches and mice) and their droppings.  Throw away plants if you see mold on them.  Clean your floors. Dust regularly. Use cleaning products that do not smell.  Have someone vacuum when you are not  home. Use a vacuum cleaner with a HEPA filter if possible.  Replace carpet with wood, tile, or vinyl flooring. Carpet can trap animal skin flakes and dust.  Use allergy-proof pillows, mattress covers, and box spring covers.  Wash bed sheets and blankets every week in hot water. Dry them in a dryer.  Keep your bedroom free of any triggers.  Avoid pets and keep windows closed when things that cause allergy symptoms are in the air.  Use blankets that are made of polyester or cotton.  Clean bathrooms and kitchens with bleach. If possible, have someone repaint the walls in these rooms with mold-resistant paint. Keep out of the rooms that are being cleaned and painted.  Wash your hands often with soap and water. If soap and water are not available, use hand sanitizer.  Do not allow anyone to smoke in your home. General instructions  Take over-the-counter and prescription medicines only as told by your doctor. ? Talk with your doctor if you have questions about how or when to take your medicines. ? Make note if you need to use your medicines more often than usual.  Do not use any products that contain nicotine or tobacco, such as cigarettes and e-cigarettes. If you need help quitting, ask your doctor.  Stay away from secondhand smoke.  Avoid doing things outdoors when allergen counts are high and when air quality is low.  Wear a ski mask when doing outdoor activities in the winter. The mask should cover your nose and mouth. Exercise indoors on cold days if you can.  Warm up before you exercise. Take time to cool down after exercise.  Use a peak flow meter as told by your doctor. A peak flow meter is a tool that measures how well the lungs are working.  Keep track of the peak flow meter's readings. Write them down.  Follow your asthma action plan. This is a written plan for taking care of your asthma and treating your attacks.  Make sure you get all the shots (vaccines) that your  doctor recommends. Ask your doctor about a flu shot and a pneumonia shot.  Keep all follow-up visits as told by your doctor. This is important. Contact a doctor if:  You have wheezing, shortness of breath, or a cough even while taking medicine to prevent attacks.  The mucus you cough up (sputum) is thicker than usual.  The  mucus you cough up changes from clear or white to yellow, green, gray, or bloody.  You have problems from the medicine you are taking, such as: ? A rash. ? Itching. ? Swelling. ? Trouble breathing.  You need reliever medicines more than 2-3 times a week.  Your peak flow reading is still at 50-79% of your personal best after following the action plan for 1 hour.  You have a fever. Get help right away if:  You seem to be worse and are not responding to medicine during an asthma attack.  You are short of breath even at rest.  You get short of breath when doing very little activity.  You have trouble eating, drinking, or talking.  You have chest pain or tightness.  You have a fast heartbeat.  Your lips or fingernails start to turn blue.  You are light-headed or dizzy, or you faint.  Your peak flow is less than 50% of your personal best.  You feel too tired to breathe normally. Summary  Asthma is a long-term (chronic) condition in which the airways get tight and narrow. An asthma attack can make it hard to breathe.  Asthma cannot be cured, but medicines and lifestyle changes can help control it.  Make sure you understand how to avoid triggers and how and when to use your medicines. This information is not intended to replace advice given to you by your health care provider. Make sure you discuss any questions you have with your health care provider. Document Released: 09/21/2007 Document Revised: 06/07/2018 Document Reviewed: 05/09/2016 Elsevier Patient Education  2020 [employer]. Safe Sex Practicing safe sex means taking steps before and during  sex to reduce your risk of:  Getting an STI (sexually transmitted infection).  Giving your partner an STI.  Unwanted or unplanned pregnancy. How can I practice safe sex?     Ways you can practice safe sex  Limit your sexual partners to only one partner who is having sex with only you.  Avoid using alcohol and drugs before having sex. Alcohol and drugs can affect your judgment.  Before having sex with a new partner: ? Talk to your partner about past partners, past STIs, and drug use. ? Get screened for STIs and discuss the results with your partner. Ask your partner to get screened, too.  Check your body regularly for sores, blisters, rashes, or unusual discharge. If you notice any of these problems, visit your health care provider.  Avoid sexual contact if you have symptoms of an infection or you are being treated for an STI.  While having sex, use a condom. Make sure to: ? Use a condom every time you have vaginal, oral, or anal sex. Both females and males should wear condoms during oral sex. ? Keep condoms in place from the beginning to the end of sexual activity. ? Use a latex condom, if possible. Latex condoms offer the best protection. ? Use only water-based lubricants with a condom. Using petroleum-based lubricants or oils will weaken the condom and increase the chance that it will break. Ways your health care provider can help you practice safe sex  See your health care provider for regular screenings, exams, and tests for STIs.  Talk with your health care provider about what kind of birth control (contraception) is best for you.  Get vaccinated against hepatitis B and human papillomavirus (HPV).  If you are at risk of being infected with HIV (human immunodeficiency virus), talk with your health care provider  about taking a prescription medicine to prevent HIV infection. You are at risk for HIV if you: ? Are a man who has sex with other men. ? Are sexually active with  more than one partner. ? Take drugs by injection. ? Have a sex partner who has HIV. ? Have unprotected sex. ? Have sex with someone who has sex with both men and women. ? Have had an STI. Follow these instructions at home:  Take over-the-counter and prescription medicines as told by your health care provider.  Keep all follow-up visits as told by your health care provider. This is important. Where to find more information  Centers for Disease Control and Prevention: LessFurniture.be  Planned Parenthood: https://www.plannedparenthood.org/  Office on Women's Health: EmploymentTracking.tn Summary  Practicing safe sex means taking steps before and during sex to reduce your risk of STIs, giving your partner STIs, and having an unwanted or unplanned pregnancy.  Before having sex with a new partner, talk to your partner about past partners, past STIs, and drug use.  Use a condom every time you have vaginal, oral, or anal sex. Both females and males should wear condoms during oral sex.  Check your body regularly for sores, blisters, rashes, or unusual discharge. If you notice any of these problems, visit your health care provider.  See your health care provider for regular screenings, exams, and tests for STIs. This information is not intended to replace advice given to you by your health care provider. Make sure you discuss any questions you have with your health care provider. Document Released: 05/12/2004 Document Revised: 07/27/2018 Document Reviewed: 01/15/2018 Elsevier Patient Education  2020 [employer]. Trichomoniasis Trichomoniasis is an STI (sexually transmitted infection) that can affect both women and men. In women, the outer area of the female genitalia (vulva) and the vagina are affected. In men, mainly the penis is affected, but the prostate and other reproductive organs can also be involved.   This condition can be treated with medicine. It often has no symptoms (is asymptomatic), especially in men. If not treated, trichomoniasis can last for months or years. What are the causes? This condition is caused by a parasite called Trichomonas vaginalis. Trichomoniasis most often spreads from person to person (is contagious) through sexual contact. What increases the risk? The following factors may make you more likely to develop this condition:  Having unprotected sex.  Having sex with a partner who has trichomoniasis.  Having multiple sexual partners.  Having had previous trichomoniasis infections or other STIs. What are the signs or symptoms? In women, symptoms of trichomoniasis include:  Abnormal vaginal discharge that is clear, white, gray, or yellow-green and foamy and has an unusual "fishy" odor.  Itching and irritation of the vagina and vulva.  Burning or pain during urination or sex.  Redness and swelling of the genitals. In men, symptoms of trichomoniasis include:  Penile discharge that may be foamy or contain pus.  Pain in the penis. This may happen only when urinating.  Itching or irritation inside the penis.  Burning after urination or ejaculation. How is this diagnosed? In women, this condition may be found during a routine Pap test or physical exam. It may be found in men during a routine physical exam. Your health care provider may do tests to help diagnose this infection, such as:  Urine tests (men and women).  The following in women: ? Testing the pH of the vagina. ? A vaginal swab test that checks for the Trichomonas vaginalis parasite. ?  Testing vaginal secretions. Your health care provider may test you for other STIs, including HIV (human immunodeficiency virus). How is this treated? This condition is treated with medicine taken by mouth (orally), such as metronidazole or tinidazole, to fight the infection. Your sexual partner(s) also need to be  tested and treated.  If you are a woman and you plan to become pregnant or think you may be pregnant, tell your health care provider right away. Some medicines that are used to treat the infection should not be taken during pregnancy. Your health care provider may recommend over-the-counter medicines or creams to help relieve itching or irritation. You may be tested for infection again 3 months after treatment. Follow these instructions at home:  Take and use over-the-counter and prescription medicines, including creams, only as told by your health care provider.  Take your antibiotic medicine as told by your health care provider. Do not stop taking the antibiotic even if you start to feel better.  Do not have sex until 7-10 days after you finish your medicine, or until your health care provider approves. Ask your health care provider when you may start to have sex again.  (Women) Do not douche or wear tampons while you have the infection.  Discuss your infection with your sexual partner(s). Make sure that your partner gets tested and treated, if necessary.  Keep all follow-up visits as told by your health care provider. This is important. How is this prevented?   Use condoms every time you have sex. Using condoms correctly and consistently can help protect against STIs.  Avoid having multiple sexual partners.  Talk with your sexual partner about any symptoms that either of you may have, as well as any history of STIs.  Get tested for STIs and STDs (sexually transmitted diseases) before you have sex. Ask your partner to do the same.  Do not have sexual contact if you have symptoms of trichomoniasis or another STI. Contact a health care provider if:  You still have symptoms after you finish your medicine.  You develop pain in your abdomen.  You have pain when you urinate.  You have bleeding after sex.  You develop a rash.  You feel nauseous or you vomit.  You plan to become  pregnant or think you may be pregnant. Summary  Trichomoniasis is an STI (sexually transmitted infection) that can affect both women and men.  This condition often has no symptoms (is asymptomatic), especially in men.  Without treatment, this condition can last for months or years.  You should not have sex until 7-10 days after you finish your medicine, or until your health care provider approves. Ask your health care provider when you may start to have sex again.  Discuss your infection with your sexual partner(s). Make sure that your partner gets tested and treated, if necessary. This information is not intended to replace advice given to you by your health care provider. Make sure you discuss any questions you have with your health care provider. Document Released: 09/28/2000 Document Revised: 01/16/2018 Document Reviewed: 01/16/2018 Elsevier Patient Education  2020 [employer]

## 2019-02-28 NOTE — [facility] Note (Signed)
Abdominal pain and lower back pain for couple days. Pink spotting. Sometimes is hard to catch my breath. Hx asthma as child. LMP 12/06/18

## 2019-03-01 ENCOUNTER — Encounter: Payer: Self-pay | Admitting: Advanced Practice Midwife

## 2019-03-01 DIAGNOSIS — O209 Hemorrhage in early pregnancy, unspecified: Secondary | ICD-10-CM | POA: Insufficient documentation

## 2019-03-01 DIAGNOSIS — A5901 Trichomonal vulvovaginitis: Secondary | ICD-10-CM | POA: Insufficient documentation

## 2019-03-01 DIAGNOSIS — O23599 Infection of other part of genital tract in pregnancy, unspecified trimester: Secondary | ICD-10-CM | POA: Insufficient documentation

## 2019-03-04 LAB — GC/CHLAMYDIA PROBE AMP (~~LOC~~) NOT AT ARMC
Chlamydia: POSITIVE — AB
Comment: NEGATIVE
Comment: NORMAL
Neisseria Gonorrhea: NEGATIVE

## 2019-03-05 ENCOUNTER — Other Ambulatory Visit: Payer: Self-pay

## 2019-03-05 NOTE — Telephone Encounter (Signed)
Attempted to reach patient by phone per Hansel Feinstein, CNM request. Patient was [facility] patient. Attempted to get her pharmacy to send in treatment for Chlamydia.  No answer at phone number.

## 2019-03-26 ENCOUNTER — Encounter: Payer: Medicaid Other | Admitting: Family Medicine

## 2019-03-26 DIAGNOSIS — Z3480 Encounter for supervision of other normal pregnancy, unspecified trimester: Secondary | ICD-10-CM | POA: Insufficient documentation

## 2019-04-24 ENCOUNTER — Encounter: Payer: Self-pay | Admitting: Certified Nurse Midwife

## 2019-04-24 ENCOUNTER — Ambulatory Visit (INDEPENDENT_AMBULATORY_CARE_PROVIDER_SITE_OTHER): Payer: Medicaid Other | Admitting: Certified Nurse Midwife

## 2019-04-24 ENCOUNTER — Other Ambulatory Visit: Payer: Self-pay

## 2019-04-24 VITALS — BP 108/71 | HR 79 | Wt 221.6 lb

## 2019-04-24 DIAGNOSIS — Z3481 Encounter for supervision of other normal pregnancy, first trimester: Secondary | ICD-10-CM | POA: Diagnosis not present

## 2019-04-24 DIAGNOSIS — Z98891 History of uterine scar from previous surgery: Secondary | ICD-10-CM | POA: Insufficient documentation

## 2019-04-24 DIAGNOSIS — Z3480 Encounter for supervision of other normal pregnancy, unspecified trimester: Secondary | ICD-10-CM

## 2019-04-24 DIAGNOSIS — O23592 Infection of other part of genital tract in pregnancy, second trimester: Secondary | ICD-10-CM

## 2019-04-24 DIAGNOSIS — Z3A19 19 weeks gestation of pregnancy: Secondary | ICD-10-CM

## 2019-04-24 DIAGNOSIS — O99210 Obesity complicating pregnancy, unspecified trimester: Secondary | ICD-10-CM

## 2019-04-24 DIAGNOSIS — O99212 Obesity complicating pregnancy, second trimester: Secondary | ICD-10-CM

## 2019-04-24 DIAGNOSIS — N898 Other specified noninflammatory disorders of vagina: Secondary | ICD-10-CM

## 2019-04-24 DIAGNOSIS — A5901 Trichomonal vulvovaginitis: Secondary | ICD-10-CM

## 2019-04-24 DIAGNOSIS — O34219 Maternal care for unspecified type scar from previous cesarean delivery: Secondary | ICD-10-CM

## 2019-04-24 DIAGNOSIS — O26892 Other specified pregnancy related conditions, second trimester: Secondary | ICD-10-CM

## 2019-04-24 MED ORDER — TERCONAZOLE 0.4 % VA CREA: 1.0000 | TOPICAL_CREAM | Freq: Every day | VAGINAL | 0 refills | Status: DC

## 2019-04-24 MED ORDER — ASPIRIN EC 81 MG PO TBEC: 81.0000 mg | DELAYED_RELEASE_TABLET | Freq: Every day | ORAL | 0 refills | Status: DC

## 2019-04-24 NOTE — Progress Notes (Signed)
Pt presents for Initial OB visit. Pt has no concerns today. Flu vaccine given today.

## 2019-04-24 NOTE — Patient Instructions (Signed)
Safe Medications in Pregnancy   Acne: Benzoyl Peroxide Salicylic Acid  Backache/Headache: Tylenol: 2 regular strength every 4 hours OR              2 Extra strength every 6 hours  Colds/Coughs/Allergies: Benadryl (alcohol free) 25 mg every 6 hours as needed Breath right strips Claritin Cepacol throat lozenges Chloraseptic throat spray Cold-Eeze- up to three times per day Cough drops, alcohol free Flonase (by prescription only) Guaifenesin Mucinex Robitussin DM (plain only, alcohol free) Saline nasal spray/drops Sudafed (pseudoephedrine) & Actifed ** use only after [redacted] weeks gestation and if you do not have high blood pressure Tylenol Vicks Vaporub Zinc lozenges Zyrtec   Constipation: Colace Ducolax suppositories Fleet enema Glycerin suppositories Metamucil Milk of magnesia Miralax Senokot Smooth move tea  Diarrhea: Kaopectate Imodium A-D  *NO pepto Bismol  Hemorrhoids: Anusol Anusol HC Preparation H Tucks  Indigestion: Tums Maalox Mylanta Zantac  Pepcid  Insomnia: Benadryl (alcohol free) 25mg every 6 hours as needed Tylenol PM Unisom, no Gelcaps  Leg Cramps: Tums MagGel  Nausea/Vomiting:  Bonine Dramamine Emetrol Ginger extract Sea bands Meclizine  Nausea medication to take during pregnancy:  Unisom (doxylamine succinate 25 mg tablets) Take one tablet daily at bedtime. If symptoms are not adequately controlled, the dose can be increased to a maximum recommended dose of two tablets daily (1/2 tablet in the morning, 1/2 tablet mid-afternoon and one at bedtime). Vitamin B6 100mg tablets. Take one tablet twice a day (up to 200 mg per day).  Skin Rashes: Aveeno products Benadryl cream or 25mg every 6 hours as needed Calamine Lotion 1% cortisone cream  Yeast infection: Gyne-lotrimin 7 Monistat 7   **If taking multiple medications, please check labels to avoid duplicating the same active ingredients **take medication as directed on  the label ** Do not exceed 4000 mg of tylenol in 24 hours **Do not take medications that contain aspirin or ibuprofen     Second Trimester of Pregnancy  The second trimester is from week 14 through week 27 (month 4 through 6). This is often the time in pregnancy that you feel your best. Often times, morning sickness has lessened or quit. You may have more energy, and you may get hungry more often. Your unborn baby is growing rapidly. At the end of the sixth month, he or she is about 9 inches long and weighs about 1 pounds. You will likely feel the baby move between 18 and 20 weeks of pregnancy. Follow these instructions at home: Medicines  Take over-the-counter and prescription medicines only as told by your doctor. Some medicines are safe and some medicines are not safe during pregnancy.  Take a prenatal vitamin that contains at least 600 micrograms (mcg) of folic acid.  If you have trouble pooping (constipation), take medicine that will make your stool soft (stool softener) if your doctor approves. Eating and drinking   Eat regular, healthy meals.  Avoid raw meat and uncooked cheese.  If you get low calcium from the food you eat, talk to your doctor about taking a daily calcium supplement.  Avoid foods that are high in fat and sugars, such as fried and sweet foods.  If you feel sick to your stomach (nauseous) or throw up (vomit): ? Eat 4 or 5 small meals a day instead of 3 large meals. ? Try eating a few soda crackers. ? Drink liquids between meals instead of during meals.  To prevent constipation: ? Eat foods that are high in fiber, like fresh fruits   and vegetables, whole grains, and beans. ? Drink enough fluids to keep your pee (urine) clear or pale yellow. Activity  Exercise only as told by your doctor. Stop exercising if you start to have cramps.  Do not exercise if it is too hot, too humid, or if you are in a place of great height (high altitude).  Avoid heavy  lifting.  Wear low-heeled shoes. Sit and stand up straight.  You can continue to have sex unless your doctor tells you not to. Relieving pain and discomfort  Wear a good support bra if your breasts are tender.  Take warm water baths (sitz baths) to soothe pain or discomfort caused by hemorrhoids. Use hemorrhoid cream if your doctor approves.  Rest with your legs raised if you have leg cramps or low back pain.  If you develop puffy, bulging veins (varicose veins) in your legs: ? Wear support hose or compression stockings as told by your doctor. ? Raise (elevate) your feet for 15 minutes, 3-4 times a day. ? Limit salt in your food. Prenatal care  Write down your questions. Take them to your prenatal visits.  Keep all your prenatal visits as told by your doctor. This is important. Safety  Wear your seat belt when driving.  Make a list of emergency phone numbers, including numbers for family, friends, the hospital, and police and fire departments. General instructions  Ask your doctor about the right foods to eat or for help finding a counselor, if you need these services.  Ask your doctor about local prenatal classes. Begin classes before month 6 of your pregnancy.  Do not use hot tubs, steam rooms, or saunas.  Do not douche or use tampons or scented sanitary pads.  Do not cross your legs for long periods of time.  Visit your dentist if you have not done so. Use a soft toothbrush to brush your teeth. Floss gently.  Avoid all smoking, herbs, and alcohol. Avoid drugs that are not approved by your doctor.  Do not use any products that contain nicotine or tobacco, such as cigarettes and e-cigarettes. If you need help quitting, ask your doctor.  Avoid cat litter boxes and soil used by cats. These carry germs that can cause birth defects in the baby and can cause a loss of your baby (miscarriage) or stillbirth. Contact a doctor if:  You have mild cramps or pressure in your  lower belly.  You have pain when you pee (urinate).  You have bad smelling fluid coming from your vagina.  You continue to feel sick to your stomach (nauseous), throw up (vomit), or have watery poop (diarrhea).  You have a nagging pain in your belly area.  You feel dizzy. Get help right away if:  You have a fever.  You are leaking fluid from your vagina.  You have spotting or bleeding from your vagina.  You have severe belly cramping or pain.  You lose or gain weight rapidly.  You have trouble catching your breath and have chest pain.  You notice sudden or extreme puffiness (swelling) of your face, hands, ankles, feet, or legs.  You have not felt the baby move in over an hour.  You have severe headaches that do not go away when you take medicine.  You have trouble seeing. Summary  The second trimester is from week 14 through week 27 (months 4 through 6). This is often the time in pregnancy that you feel your best.  To take care of yourself and   your unborn baby, you will need to eat healthy meals, take medicines only if your doctor tells you to do so, and do activities that are safe for you and your baby.  Call your doctor if you get sick or if you notice anything unusual about your pregnancy. Also, call your doctor if you need help with the right food to eat, or if you want to know what activities are safe for you. This information is not intended to replace advice given to you by your health care provider. Make sure you discuss any questions you have with your health care provider. Document Revised: 07/27/2018 Document Reviewed: 05/10/2016 Elsevier Patient Education  2020 [employer]  

## 2019-04-24 NOTE — Progress Notes (Signed)
History:   Ruth Perry is a 29 y.o. G2P1001 at [redacted]w[redacted]d by LMP being seen today for her first obstetrical visit.  Her obstetrical history is significant for obesity and hx of cesarean section. Patient does intend to breast feed. Pregnancy history fully reviewed.  Patient reports vaginal irritation and itching .     HISTORY: OB History  Gravida Para Term Preterm AB Living  2 1 1  0 0 1  SAB TAB Ectopic Multiple Live Births  0 0 0 0 1    # Outcome Date GA Lbr Len/2nd Weight Sex Delivery Anes PTL Lv  2 Current           1 Term 04/01/13    M CS-LTranv   LIV    Last pap smear was done 2018 and was normal per patient- no records in chart  Past Medical History:  Diagnosis Date  . Asthma    as a child   Past Surgical History:  Procedure Laterality Date  . CESAREAN SECTION    . CHOLECYSTECTOMY     Family History  Problem Relation Age of Onset  . Heart attack Mother    Social History   Tobacco Use  . Smoking status: Never Smoker  . Smokeless tobacco: Never Used  Substance Use Topics  . Alcohol use: Never  . Drug use: Never   No Known Allergies Current Outpatient Medications on File Prior to Visit  Medication Sig Dispense Refill  . albuterol (VENTOLIN HFA) 108 (90 Base) MCG/ACT inhaler Inhale 2 puffs into the lungs every 6 (six) hours as needed for wheezing or shortness of breath. 6.7 g 0  . Prenatal MV-Min-FA-Omega-3 (PRENATAL GUMMIES/DHA & FA PO) Take by mouth.     No current facility-administered medications on file prior to visit.    Review of Systems Pertinent items noted in HPI and remainder of comprehensive ROS otherwise negative. Physical Exam:   Vitals:   04/24/19 0825  BP: 108/71  Pulse: 79  Weight: 221 lb 9.6 oz (100.5 kg)   Fetal Heart Rate (bpm): 141 Uterus:  Fundal Height: 26 cm - gravid large for gestational age - certain dates per patient   Pelvic Exam: Perineum: no hemorrhoids, normal perineum   Vulva: normal external genitalia, no lesions   Vagina:  normal mucosa, large amount of yellow curdy discharge without odor    Cervix: no lesions and normal, pap smear done.    Adnexa: normal adnexa and no mass, fullness, tenderness   Bony Pelvis: average  System: General: well-developed, morbid obese female in no acute distress   Breasts:  normal appearance, no masses or tenderness bilaterally   Skin: normal coloration and turgor, no rashes   Neurologic: oriented, normal, negative, normal mood   Extremities: normal strength, tone, and muscle mass, ROM of all joints is normal   HEENT PERRLA, extraocular movement intact and sclera clear   Mouth/Teeth mucous membranes moist, pharynx normal without lesions and dental hygiene good   Neck supple and no masses   Cardiovascular: regular rate and rhythm   Respiratory:  no respiratory distress, normal breath sounds   Abdomen: Gravid large for gestational age, soft, non-tender; bowel sounds normal; no masses,  no organomegaly     Assessment:    Pregnancy: G2P1001 Patient Active Problem List   Diagnosis Date Noted  . History of cesarean delivery, currently pregnant 04/24/2019  . Obesity during pregnancy, antepartum 04/24/2019  . Supervision of other normal pregnancy, antepartum 03/26/2019  . Bleeding in early pregnancy 03/01/2019  .  Trichomonal vaginitis during pregnancy 03/01/2019     Plan:    1. Supervision of other normal pregnancy, antepartum - Welcomed to practice and introduced self to patient  - Anticipatory guidance on upcoming appointments  - Discussed COVID 19 and pregnancy in relation to prenatal appointments and the integration of virtual appointments  - Discussed use of babyscripts app  - Obstetric Panel, Including HIV - Babyscripts Schedule Optimization - Urine Culture - Genetic Screening - HgB A1c - Cytology - PAP( [place]) - AFP only - [place] MFM OB DETAIL +14 WK; Future  2. Trichomonal vaginitis during pregnancy in first trimester - Diagnoses on Trich and  Chlamydia on 11/12- tx received and completed per patient  - TOC completed today  - Cervicovaginal ancillary only( [place])  3. History of cesarean delivery, currently pregnant - Patient reports hx of prior cesarean section for failure to descent in 2014 - Patient reports C/S was in [place] - need records from surgery, release of record to be signed at check out  - Patient reports baby was 6lb6oz and "would not come down into pelvis" - Wants TOLAC, educated and discussed with patient that next appointment will be with MD to discuss r/b of repeat C/S vs TOLAC  4. Obesity during pregnancy, antepartum - BMI 40.53  - bASA initiated and HA1c obtained  - aspirin EC 81 MG tablet; Take 1 tablet (81 mg total) by mouth daily. Take after 12 weeks for prevention of preeclampsia later in pregnancy  Dispense: 300 tablet; Refill: 0  5. Vaginal discharge during pregnancy in second trimester - Patient reports vaginal irritation and itching over the past 2 weeks with a discharge  - She denies vaginal odor and reports completion of medication for STDs  - terconazole (TERAZOL 7) 0.4 % vaginal cream; Place 1 applicator vaginally at bedtime. Use for seven days  Dispense: 45 g; Refill: 0   Initial labs drawn. Continue prenatal vitamins. Genetic Screening discussed, AFP and NIPS: ordered. Ultrasound discussed; fetal anatomic survey: ordered. Problem list reviewed and updated. The nature of [facility] with multiple MDs and other Advanced Practice Providers was explained to patient; also emphasized that residents, students are part of our team. Routine obstetric precautions reviewed. Return in about 4 weeks (around 05/22/2019) for ROB- with MD to discussed TOLAC.     Sharyon Cable, CNM [facility] [employer], [facility]

## 2019-04-26 LAB — OBSTETRIC PANEL, INCLUDING HIV
Basophils Absolute: 0 10*3/uL (ref 0.0–0.2)
Basos: 0 %
EOS (ABSOLUTE): 0 10*3/uL (ref 0.0–0.4)
Eos: 0 %
HIV Screen 4th Generation wRfx: NONREACTIVE
Hematocrit: 34.6 % (ref 34.0–46.6)
Hemoglobin: 11.8 g/dL (ref 11.1–15.9)
Hepatitis B Surface Ag: NEGATIVE
Immature Grans (Abs): 0 10*3/uL (ref 0.0–0.1)
Immature Granulocytes: 0 %
Lymphocytes Absolute: 2.8 10*3/uL (ref 0.7–3.1)
Lymphs: 22 %
MCH: 30.1 pg (ref 26.6–33.0)
MCHC: 34.1 g/dL (ref 31.5–35.7)
MCV: 88 fL (ref 79–97)
Monocytes Absolute: 0.7 10*3/uL (ref 0.1–0.9)
Monocytes: 5 %
Neutrophils Absolute: 8.8 10*3/uL — ABNORMAL HIGH (ref 1.4–7.0)
Neutrophils: 73 %
Platelets: 281 10*3/uL (ref 150–450)
RBC: 3.92 x10E6/uL (ref 3.77–5.28)
RDW: 14 % (ref 11.7–15.4)
RPR Ser Ql: NONREACTIVE
Rh Factor: POSITIVE
Rubella Antibodies, IGG: 1.19 index (ref >0.99)
WBC: 12.3 10*3/uL — ABNORMAL HIGH (ref 3.4–10.8)

## 2019-04-26 LAB — URINE CULTURE

## 2019-04-26 LAB — HEMOGLOBIN A1C
Est. average glucose Bld gHb Est-mCnc: 103 mg/dL
Hgb A1c MFr Bld: 5.2 % (ref 4.8–5.6)

## 2019-04-26 LAB — AFP, SERUM, OPEN SPINA BIFIDA
AFP MoM: 0.93
AFP Value: 39.7 ng/mL
Gest. Age on Collection Date: 19.6 weeks
Maternal Age At EDD: 26.8 yr
OSBR Risk 1 IN: 10000
Test Results:: NEGATIVE
Weight: 221 [lb_av]

## 2019-04-26 LAB — AB SCR+ANTIBODY ID

## 2019-05-01 ENCOUNTER — Encounter: Payer: Self-pay | Admitting: Certified Nurse Midwife

## 2019-05-06 ENCOUNTER — Encounter: Payer: Self-pay | Admitting: Obstetrics

## 2019-05-09 ENCOUNTER — Encounter: Payer: Self-pay | Admitting: Family Medicine

## 2019-05-09 DIAGNOSIS — A749 Chlamydial infection, unspecified: Secondary | ICD-10-CM | POA: Insufficient documentation

## 2019-05-10 ENCOUNTER — Encounter: Payer: Self-pay | Admitting: *Deleted

## 2019-05-10 ENCOUNTER — Other Ambulatory Visit: Payer: Self-pay | Admitting: Certified Nurse Midwife

## 2019-05-10 MED ORDER — AZITHROMYCIN 500 MG PO TABS: ORAL_TABLET | ORAL | 0 refills | Status: DC

## 2019-05-15 ENCOUNTER — Ambulatory Visit (HOSPITAL_COMMUNITY): Payer: Medicaid Other | Attending: Obstetrics and Gynecology

## 2019-05-15 ENCOUNTER — Ambulatory Visit (HOSPITAL_COMMUNITY): Payer: Medicaid Other

## 2019-05-21 ENCOUNTER — Encounter: Payer: Medicaid Other | Admitting: Obstetrics and Gynecology

## 2019-06-14 ENCOUNTER — Other Ambulatory Visit: Payer: Self-pay

## 2019-06-14 NOTE — Progress Notes (Signed)
Pt called reporting she received a letter from [place] notifying her of unsatisfactory pap and swab from her visit in January. Pt has appt scheduled for 3/1 and I advised pt we will reevaluate at that time. Pt voices understanding.

## 2019-06-17 ENCOUNTER — Encounter: Payer: Medicaid Other | Admitting: Obstetrics and Gynecology

## 2019-06-19 ENCOUNTER — Encounter: Payer: Self-pay | Admitting: Obstetrics

## 2019-07-01 ENCOUNTER — Ambulatory Visit (HOSPITAL_COMMUNITY)
Admission: RE | Admit: 2019-07-01 | Payer: Medicaid Other | Source: Ambulatory Visit | Attending: Certified Nurse Midwife | Admitting: Certified Nurse Midwife

## 2019-07-01 ENCOUNTER — Ambulatory Visit (HOSPITAL_COMMUNITY): Payer: Medicaid Other

## 2019-07-08 ENCOUNTER — Ambulatory Visit (INDEPENDENT_AMBULATORY_CARE_PROVIDER_SITE_OTHER): Payer: Medicaid Other | Admitting: Obstetrics & Gynecology

## 2019-07-08 ENCOUNTER — Other Ambulatory Visit: Payer: Self-pay

## 2019-07-08 ENCOUNTER — Other Ambulatory Visit: Payer: Medicaid Other

## 2019-07-08 ENCOUNTER — Other Ambulatory Visit (HOSPITAL_COMMUNITY)
Admission: RE | Admit: 2019-07-08 | Discharge: 2019-07-08 | Disposition: A | Discharge status: Home / Self Care | Payer: Medicaid Other | Source: Ambulatory Visit | Attending: Obstetrics & Gynecology | Admitting: Obstetrics & Gynecology

## 2019-07-08 VITALS — BP 112/74 | HR 90 | Wt 228.6 lb

## 2019-07-08 DIAGNOSIS — Z23 Encounter for immunization: Secondary | ICD-10-CM | POA: Diagnosis not present

## 2019-07-08 DIAGNOSIS — O98813 Other maternal infectious and parasitic diseases complicating pregnancy, third trimester: Secondary | ICD-10-CM

## 2019-07-08 DIAGNOSIS — A749 Chlamydial infection, unspecified: Secondary | ICD-10-CM

## 2019-07-08 DIAGNOSIS — B9689 Other specified bacterial agents as the cause of diseases classified elsewhere: Secondary | ICD-10-CM

## 2019-07-08 DIAGNOSIS — Z3480 Encounter for supervision of other normal pregnancy, unspecified trimester: Secondary | ICD-10-CM | POA: Diagnosis present

## 2019-07-08 DIAGNOSIS — O98313 Other infections with a predominantly sexual mode of transmission complicating pregnancy, third trimester: Secondary | ICD-10-CM

## 2019-07-08 DIAGNOSIS — O34219 Maternal care for unspecified type scar from previous cesarean delivery: Secondary | ICD-10-CM

## 2019-07-08 DIAGNOSIS — B373 Candidiasis of vulva and vagina: Secondary | ICD-10-CM

## 2019-07-08 DIAGNOSIS — N760 Acute vaginitis: Secondary | ICD-10-CM

## 2019-07-08 DIAGNOSIS — O3663X0 Maternal care for excessive fetal growth, third trimester, not applicable or unspecified: Secondary | ICD-10-CM

## 2019-07-08 DIAGNOSIS — B3731 Acute candidiasis of vulva and vagina: Secondary | ICD-10-CM

## 2019-07-08 NOTE — Progress Notes (Signed)
   PRENATAL VISIT NOTE  Subjective:  Ruth Perry is a 29 y.o. G2P1001 at [redacted]w[redacted]d being seen today for ongoing prenatal care.  She is currently monitored for the following issues for this low-risk pregnancy and has Bleeding in early pregnancy; Trichomonal vaginitis during pregnancy; Supervision of other normal pregnancy, antepartum; History of cesarean delivery, currently pregnant; Obesity during pregnancy, antepartum; and Chlamydia infection affecting pregnancy on their problem list.  Patient reports no complaints.  Contractions: Not present. Vag. Bleeding: None.  Movement: Present. Denies leaking of fluid.   The following portions of the patient's history were reviewed and updated as appropriate: allergies, current medications, past family history, past medical history, past social history, past surgical history and problem list.   Objective:   Vitals:   07/08/19 0937  BP: 112/74  Pulse: 90  Weight: 228 lb 9.6 oz (103.7 kg)    Fetal Status: Fetal Heart Rate (bpm): 135 Fundal Height: 37 cm Movement: Present     General:  Alert, oriented and cooperative. Patient is in no acute distress.  Skin: Skin is warm and dry. No rash noted.   Cardiovascular: Normal heart rate noted  Respiratory: Normal respiratory effort, no problems with respiration noted  Abdomen: Soft, gravid, appropriate for gestational age.  Pain/Pressure: Absent     Pelvic: Cervical exam deferred        Extremities: Normal range of motion.  Edema: None  Mental Status: Normal mood and affect. Normal behavior. Normal judgment and thought content.   Assessment and Plan:  Pregnancy: G2P1001 at [redacted]w[redacted]d 1. Large for dates affecting management of mother, third trimester, not applicable or unspecified fetus Worried about dates; patient reports being dated by LMP and early scan done at [facility] but we do not have this report. She has missed multiple ultrasound appointments. Is scheduled on 07/10/19, emphasized importance  of this appointment. Also concerned about AFV, fetal anomalies and size of fetus; undiagnosed DM. GTT testing today.  2. Sexually transmitted infectious disease in mother during third trimester of pregnancy Test of cure for Chlamydia and Trichomonas done today. - Cervicovaginal ancillary only( [place])  3. History of cesarean delivery, currently pregnant Done for FTP, reports having a large baby. Will await U/S results before deciding on mode. Given information to review, will discuss further and sign next visit.    4. Supervision of other normal pregnancy, antepartum Third trimester labs, Tdap today.  - Glucose Tolerance, 2 Hours w/1 Hour - CBC - HIV antibody (with reflex) - RPR - Tdap vaccine greater than or equal to 7yo IM - Hepatitis C antibody Preterm labor symptoms and general obstetric precautions including but not limited to vaginal bleeding, contractions, leaking of fluid and fetal movement were reviewed in detail with the patient. Please refer to After Visit Summary for other counseling recommendations.   Return in about 2 weeks (around 07/22/2019) for OFFICE OB Visit, sign TOLAC consent, review u/s results.  Future Appointments  Date Time Provider Department Center  07/08/2019 10:15 AM [place] Large, Jethro Bastos, MD [facility] None  07/10/2019  1:00 PM [facility] NURSE [facility]  07/10/2019  1:00 PM [facility] [place] 5 [facility]    Jaynie Collins, MD

## 2019-07-08 NOTE — Progress Notes (Addendum)
ROB/GTT/TDAP.  TDAP given in LD, tolerated well. 

## 2019-07-08 NOTE — Patient Instructions (Addendum)
Return to office for any scheduled appointments. Call the office or go to the MAU at [facility] if:  You begin to have strong, frequent contractions  Your water breaks.  Sometimes it is a big gush of fluid, sometimes it is just a trickle that keeps getting your panties wet or running down your legs  You have vaginal bleeding.  It is normal to have a small amount of spotting if your cervix was checked.   You do not feel your baby moving like normal.  If you do not, get something to eat and drink and lay down and focus on feeling your baby move.   If your baby is still not moving like normal, you should call the office or go to MAU.  Any other obstetric concerns.   Third Trimester of Pregnancy The third trimester is from week 28 through week 40 (months 7 through 9). The third trimester is a time when the unborn baby (fetus) is growing rapidly. At the end of the ninth month, the fetus is about 20 inches in length and weighs 6-10 pounds. Body changes during your third trimester Your body will continue to go through many changes during pregnancy. The changes vary from woman to woman. During the third trimester:  Your weight will continue to increase. You can expect to gain 25-35 pounds (11-16 kg) by the end of the pregnancy.  You may begin to get stretch marks on your hips, abdomen, and breasts.  You may urinate more often because the fetus is moving lower into your pelvis and pressing on your bladder.  You may develop or continue to have heartburn. This is caused by increased hormones that slow down muscles in the digestive tract.  You may develop or continue to have constipation because increased hormones slow digestion and cause the muscles that push waste through your intestines to relax.  You may develop hemorrhoids. These are swollen veins (varicose veins) in the rectum that can itch or be painful.  You may develop swollen, bulging veins (varicose veins)  in your legs.  You may have increased body aches in the pelvis, back, or thighs. This is due to weight gain and increased hormones that are relaxing your joints.  You may have changes in your hair. These can include thickening of your hair, rapid growth, and changes in texture. Some women also have hair loss during or after pregnancy, or hair that feels dry or thin. Your hair will most likely return to normal after your baby is born.  Your breasts will continue to grow and they will continue to become tender. A yellow fluid (colostrum) may leak from your breasts. This is the first milk you are producing for your baby.  Your belly button may stick out.  You may notice more swelling in your hands, face, or ankles.  You may have increased tingling or numbness in your hands, arms, and legs. The skin on your belly may also feel numb.  You may feel short of breath because of your expanding uterus.  You may have more problems sleeping. This can be caused by the size of your belly, increased need to urinate, and an increase in your body's metabolism.  You may notice the fetus "dropping," or moving lower in your abdomen (lightening).  You may have increased vaginal discharge.  You may notice your joints feel loose and you may have pain around your pelvic bone. What to expect at prenatal visits You will have   prenatal exams every 2 weeks until week 36. Then you will have weekly prenatal exams. During a routine prenatal visit:  You will be weighed to make sure you and the baby are growing normally.  Your blood pressure will be taken.  Your abdomen will be measured to track your baby's growth.  The fetal heartbeat will be listened to.  Any test results from the previous visit will be discussed.  You may have a cervical check near your due date to see if your cervix has softened or thinned (effaced).  You will be tested for Group B streptococcus. This happens between 35 and 37 weeks. Your  health care provider may ask you:  What your birth plan is.  How you are feeling.  If you are feeling the baby move.  If you have had any abnormal symptoms, such as leaking fluid, bleeding, severe headaches, or abdominal cramping.  If you are using any tobacco products, including cigarettes, chewing tobacco, and electronic cigarettes.  If you have any questions. Other tests or screenings that may be performed during your third trimester include:  Blood tests that check for low iron levels (anemia).  Fetal testing to check the health, activity level, and growth of the fetus. Testing is done if you have certain medical conditions or if there are problems during the pregnancy.  Nonstress test (NST). This test checks the health of your baby to make sure there are no signs of problems, such as the baby not getting enough oxygen. During this test, a belt is placed around your belly. The baby is made to move, and its heart rate is monitored during movement. What is false labor? False labor is a condition in which you feel small, irregular tightenings of the muscles in the womb (contractions) that usually go away with rest, changing position, or drinking water. These are called Braxton Hicks contractions. Contractions may last for hours, days, or even weeks before true labor sets in. If contractions come at regular intervals, become more frequent, increase in intensity, or become painful, you should see your health care provider. What are the signs of labor?  Abdominal cramps.  Regular contractions that start at 10 minutes apart and become stronger and more frequent with time.  Contractions that start on the top of the uterus and spread down to the lower abdomen and back.  Increased pelvic pressure and dull back pain.  A watery or bloody mucus discharge that comes from the vagina.  Leaking of amniotic fluid. This is also known as your "water breaking." It could be a slow trickle or a gush.  Let your health care provider know if it has a color or strange odor. If you have any of these signs, call your health care provider right away, even if it is before your due date. Follow these instructions at home: Medicines  Follow your health care provider's instructions regarding medicine use. Specific medicines may be either safe or unsafe to take during pregnancy.  Take a prenatal vitamin that contains at least 600 micrograms (mcg) of folic acid.  If you develop constipation, try taking a stool softener if your health care provider approves. Eating and drinking   Eat a balanced diet that includes fresh fruits and vegetables, whole grains, good sources of protein such as meat, eggs, or tofu, and low-fat dairy. Your health care provider will help you determine the amount of weight gain that is right for you.  Avoid raw meat and uncooked cheese. These carry germs that   can cause birth defects in the baby.  If you have low calcium intake from food, talk to your health care provider about whether you should take a daily calcium supplement.  Eat four or five small meals rather than three large meals a day.  Limit foods that are high in fat and processed sugars, such as fried and sweet foods.  To prevent constipation: ? Drink enough fluid to keep your urine clear or pale yellow. ? Eat foods that are high in fiber, such as fresh fruits and vegetables, whole grains, and beans. Activity  Exercise only as directed by your health care provider. Most women can continue their usual exercise routine during pregnancy. Try to exercise for 30 minutes at least 5 days a week. Stop exercising if you experience uterine contractions.  Avoid heavy lifting.  Do not exercise in extreme heat or humidity, or at high altitudes.  Wear low-heel, comfortable shoes.  Practice good posture.  You may continue to have sex unless your health care provider tells you otherwise. Relieving pain and  discomfort  Take frequent breaks and rest with your legs elevated if you have leg cramps or low back pain.  Take warm sitz baths to soothe any pain or discomfort caused by hemorrhoids. Use hemorrhoid cream if your health care provider approves.  Wear a good support bra to prevent discomfort from breast tenderness.  If you develop varicose veins: ? Wear support pantyhose or compression stockings as told by your healthcare provider. ? Elevate your feet for 15 minutes, 3-4 times a day. Prenatal care  Write down your questions. Take them to your prenatal visits.  Keep all your prenatal visits as told by your health care provider. This is important. Safety  Wear your seat belt at all times when driving.  Make a list of emergency phone numbers, including numbers for family, friends, the hospital, and police and fire departments. General instructions  Avoid cat litter boxes and soil used by cats. These carry germs that can cause birth defects in the baby. If you have a cat, ask someone to clean the litter box for you.  Do not travel far distances unless it is absolutely necessary and only with the approval of your health care provider.  Do not use hot tubs, steam rooms, or saunas.  Do not drink alcohol.  Do not use any products that contain nicotine or tobacco, such as cigarettes and e-cigarettes. If you need help quitting, ask your health care provider.  Do not use any medicinal herbs or unprescribed drugs. These chemicals affect the formation and growth of the baby.  Do not douche or use tampons or scented sanitary pads.  Do not cross your legs for long periods of time.  To prepare for the arrival of your baby: ? Take prenatal classes to understand, practice, and ask questions about labor and delivery. ? Make a trial run to the hospital. ? Visit the hospital and tour the maternity area. ? Arrange for maternity or paternity leave through employers. ? Arrange for family and  friends to take care of pets while you are in the hospital. ? Purchase a rear-facing car seat and make sure you know how to install it in your car. ? Pack your hospital bag. ? Prepare the baby's nursery. Make sure to remove all pillows and stuffed animals from the baby's crib to prevent suffocation.  Visit your dentist if you have not gone during your pregnancy. Use a soft toothbrush to brush your teeth and be   gentle when you floss. Contact a health care provider if:  You are unsure if you are in labor or if your water has broken.  You become dizzy.  You have mild pelvic cramps, pelvic pressure, or nagging pain in your abdominal area.  You have lower back pain.  You have persistent nausea, vomiting, or diarrhea.  You have an unusual or bad smelling vaginal discharge.  You have pain when you urinate. Get help right away if:  Your water breaks before 37 weeks.  You have regular contractions less than 5 minutes apart before 37 weeks.  You have a fever.  You are leaking fluid from your vagina.  You have spotting or bleeding from your vagina.  You have severe abdominal pain or cramping.  You have rapid weight loss or weight gain.  You have shortness of breath with chest pain.  You notice sudden or extreme swelling of your face, hands, ankles, feet, or legs.  Your baby makes fewer than 10 movements in 2 hours.  You have severe headaches that do not go away when you take medicine.  You have vision changes. Summary  The third trimester is from week 28 through week 40, months 7 through 9. The third trimester is a time when the unborn baby (fetus) is growing rapidly.  During the third trimester, your discomfort may increase as you and your baby continue to gain weight. You may have abdominal, leg, and back pain, sleeping problems, and an increased need to urinate.  During the third trimester your breasts will keep growing and they will continue to become tender. A yellow  fluid (colostrum) may leak from your breasts. This is the first milk you are producing for your baby.  False labor is a condition in which you feel small, irregular tightenings of the muscles in the womb (contractions) that eventually go away. These are called Braxton Hicks contractions. Contractions may last for hours, days, or even weeks before true labor sets in.  Signs of labor can include: abdominal cramps; regular contractions that start at 10 minutes apart and become stronger and more frequent with time; watery or bloody mucus discharge that comes from the vagina; increased pelvic pressure and dull back pain; and leaking of amniotic fluid. This information is not intended to replace advice given to you by your health care provider. Make sure you discuss any questions you have with your health care provider. Document Revised: 07/26/2018 Document Reviewed: 05/10/2016 Elsevier Patient Education  2020 [employer].   Vaginal Birth After Cesarean Delivery  Vaginal birth after cesarean delivery (VBAC) is giving birth vaginally after previously delivering a baby through a cesarean section (C-section). A VBAC may be a safe option for you, depending on your health and other factors. It is important to discuss VBAC with your health care provider early in your pregnancy so you can understand the risks, benefits, and options. Having these discussions early will give you time to make your birth plan. Who are the best candidates for VBAC? The best candidates for VBAC are women who:  Have had one or two prior cesarean deliveries, and the incision made during the delivery was horizontal (low transverse).  Do not have a vertical (classical) scar on their uterus.  Have not had a tear in the wall of their uterus (uterine rupture).  Plan to have more pregnancies. A VBAC is also more likely to be successful:  In women who have previously given birth vaginally.  When labor starts by itself  (spontaneously)  before the due date. What are the benefits of VBAC? The benefits of delivering your baby vaginally instead of by a cesarean delivery include:  A shorter hospital stay.  A faster recovery time.  Less pain.  Avoiding risks associated with major surgery, such as infection and blood clots.  Less blood loss and less need for donated blood (transfusions). What are the risks of VBAC? The main risk of attempting a VBAC is that it may fail, forcing your health care provider to deliver your baby by a C-section. Other risks are rare and include:  Tearing (rupture) of the scar from a past cesarean delivery.  Other risks associated with vaginal deliveries. If a repeat cesarean delivery is needed, the risks include:  Blood loss.  Infection.  Blood clot.  Damage to surrounding organs.  Removal of the uterus (hysterectomy), if it is damaged.  Placenta problems in future pregnancies. What else should I know about my options? Delivering a baby through a VBAC is similar to having a normal spontaneous vaginal delivery. Therefore, it is safe:  To try with twins.  For your health care provider to try to turn the baby from a breech position (external cephalic version) during labor.  With epidural analgesia for pain relief. Consider where you would like to deliver your baby. VBAC should be attempted in facilities where an emergency cesarean delivery can be performed. VBAC is not recommended for home births. Any changes in your health or your baby's health during your pregnancy may make it necessary to change your initial decision about VBAC. Your health care provider may recommend that you do not attempt a VBAC if:  Your baby's suspected weight is 8.8 lb (4 kg) or more.  You have preeclampsia. This is a condition that causes high blood pressure along with other symptoms, such as swelling and headaches.  You will have VBAC less than 19 months after your cesarean  delivery.  You are past your due date.  You need to have labor started (induced) because your cervix is not ready for labor (unfavorable). Where to find more information  American Pregnancy Association: americanpregnancy.org  [employer]: acog.org Summary  Vaginal birth after cesarean delivery (VBAC) is giving birth vaginally after previously delivering a baby through a cesarean section (C-section). A VBAC may be a safe option for you, depending on your health and other factors.  Discuss VBAC with your health care provider early in your pregnancy so you can understand the risks, benefits, options, and have plenty of time to make your birth plan.  The main risk of attempting a VBAC is that it may fail, forcing your health care provider to deliver your baby by a C-section. Other risks are rare. This information is not intended to replace advice given to you by your health care provider. Make sure you discuss any questions you have with your health care provider. Document Revised: 07/31/2018 Document Reviewed: 07/12/2016 Elsevier Patient Education  2020 [employer].

## 2019-07-09 LAB — CERVICOVAGINAL ANCILLARY ONLY
Bacterial Vaginitis (gardnerella): POSITIVE — AB
Candida Glabrata: NEGATIVE
Candida Vaginitis: POSITIVE — AB
Chlamydia: POSITIVE — AB
Comment: NEGATIVE
Comment: NEGATIVE
Comment: NEGATIVE
Comment: NEGATIVE
Comment: NEGATIVE
Comment: NORMAL
Neisseria Gonorrhea: NEGATIVE
Trichomonas: NEGATIVE

## 2019-07-09 LAB — CBC
Hematocrit: 36.9 % (ref 34.0–46.6)
Hemoglobin: 12.3 g/dL (ref 11.1–15.9)
MCH: 29.1 pg (ref 26.6–33.0)
MCHC: 33.3 g/dL (ref 31.5–35.7)
MCV: 87 fL (ref 79–97)
Platelets: 282 10*3/uL (ref 150–450)
RBC: 4.22 x10E6/uL (ref 3.77–5.28)
RDW: 13.3 % (ref 11.7–15.4)
WBC: 13.1 10*3/uL — ABNORMAL HIGH (ref 3.4–10.8)

## 2019-07-09 LAB — GLUCOSE TOLERANCE, 2 HOURS W/ 1HR
Glucose, 1 hour: 109 mg/dL (ref 65–179)
Glucose, 2 hour: 93 mg/dL (ref 65–152)
Glucose, Fasting: 80 mg/dL (ref 65–91)

## 2019-07-09 LAB — HIV ANTIBODY (ROUTINE TESTING W REFLEX): HIV Screen 4th Generation wRfx: NONREACTIVE

## 2019-07-09 LAB — RPR: RPR Ser Ql: NONREACTIVE

## 2019-07-09 LAB — HEPATITIS C ANTIBODY: Hep C Virus Ab: <0.1 s/co ratio (ref 0.0–0.9)

## 2019-07-09 MED ORDER — AZITHROMYCIN 500 MG PO TABS: ORAL_TABLET | ORAL | 2 refills | Status: DC

## 2019-07-09 MED ORDER — TERCONAZOLE 0.8 % VA CREA: 1.0000 | TOPICAL_CREAM | Freq: Every day | VAGINAL | 2 refills | Status: DC

## 2019-07-09 MED ORDER — METRONIDAZOLE 500 MG PO TABS: 500.0000 mg | ORAL_TABLET | Freq: Two times a day (BID) | ORAL | 2 refills | Status: DC

## 2019-07-09 NOTE — Progress Notes (Signed)
Chlamydia is still positive. Unsure if patient is being treated adequately or being re-infected. Azithromycin prescribed again; her partner(s) also need re-treatment. Emphasize importance of taking as directed and emphasize safe sex practices.  Reinfection can have bad consequences for her and her baby.  Patient also has a yeast infection and bacterial vaginitis; Terazol and Metronidazole also prescribed.  Will repeat testing for all this at her 36 week visit.  Please call to inform patient of results and advise her to pick up prescriptions and take as directed.  Jaynie Collins, MD

## 2019-07-09 NOTE — Addendum Note (Signed)
Addended by: Jaynie Collins A on: 07/09/2019 02:17 PM   Modules accepted: Orders

## 2019-07-10 ENCOUNTER — Other Ambulatory Visit: Payer: Self-pay

## 2019-07-10 ENCOUNTER — Ambulatory Visit (HOSPITAL_COMMUNITY)
Admission: RE | Admit: 2019-07-10 | Discharge: 2019-07-10 | Disposition: A | Discharge status: Home / Self Care | Payer: Medicaid Other | Source: Ambulatory Visit | Attending: Certified Nurse Midwife | Admitting: Certified Nurse Midwife

## 2019-07-10 ENCOUNTER — Other Ambulatory Visit (HOSPITAL_COMMUNITY): Payer: Self-pay | Admitting: *Deleted

## 2019-07-10 ENCOUNTER — Encounter (HOSPITAL_COMMUNITY): Payer: Self-pay

## 2019-07-10 ENCOUNTER — Ambulatory Visit (HOSPITAL_COMMUNITY): Payer: Medicaid Other | Admitting: *Deleted

## 2019-07-10 VITALS — BP 116/70 | HR 90 | Temp 98.1°F

## 2019-07-10 DIAGNOSIS — O209 Hemorrhage in early pregnancy, unspecified: Secondary | ICD-10-CM | POA: Diagnosis present

## 2019-07-10 DIAGNOSIS — O99213 Obesity complicating pregnancy, third trimester: Secondary | ICD-10-CM

## 2019-07-10 DIAGNOSIS — O99210 Obesity complicating pregnancy, unspecified trimester: Secondary | ICD-10-CM

## 2019-07-10 DIAGNOSIS — Z3480 Encounter for supervision of other normal pregnancy, unspecified trimester: Secondary | ICD-10-CM | POA: Insufficient documentation

## 2019-07-10 DIAGNOSIS — Z362 Encounter for other antenatal screening follow-up: Secondary | ICD-10-CM

## 2019-07-10 DIAGNOSIS — Z3A32 32 weeks gestation of pregnancy: Secondary | ICD-10-CM

## 2019-07-10 DIAGNOSIS — Z363 Encounter for antenatal screening for malformations: Secondary | ICD-10-CM

## 2019-07-10 DIAGNOSIS — O0933 Supervision of pregnancy with insufficient antenatal care, third trimester: Secondary | ICD-10-CM

## 2019-07-10 DIAGNOSIS — E669 Obesity, unspecified: Secondary | ICD-10-CM | POA: Diagnosis not present

## 2019-07-18 ENCOUNTER — Encounter (HOSPITAL_COMMUNITY): Payer: Self-pay | Admitting: Obstetrics & Gynecology

## 2019-07-18 ENCOUNTER — Inpatient Hospital Stay (HOSPITAL_COMMUNITY)
Admission: AD | Admit: 2019-07-18 | Discharge: 2019-07-18 | Disposition: A | Discharge status: Other Acute Inpatient Hospital | Payer: Medicaid Other | Attending: Emergency Medicine | Admitting: Emergency Medicine

## 2019-07-18 ENCOUNTER — Inpatient Hospital Stay (HOSPITAL_BASED_OUTPATIENT_CLINIC_OR_DEPARTMENT_OTHER): Payer: Medicaid Other

## 2019-07-18 ENCOUNTER — Other Ambulatory Visit: Payer: Self-pay

## 2019-07-18 DIAGNOSIS — O99213 Obesity complicating pregnancy, third trimester: Secondary | ICD-10-CM

## 2019-07-18 DIAGNOSIS — O209 Hemorrhage in early pregnancy, unspecified: Secondary | ICD-10-CM

## 2019-07-18 DIAGNOSIS — O0933 Supervision of pregnancy with insufficient antenatal care, third trimester: Secondary | ICD-10-CM | POA: Diagnosis not present

## 2019-07-18 DIAGNOSIS — Z3480 Encounter for supervision of other normal pregnancy, unspecified trimester: Secondary | ICD-10-CM

## 2019-07-18 DIAGNOSIS — O289 Unspecified abnormal findings on antenatal screening of mother: Secondary | ICD-10-CM | POA: Diagnosis not present

## 2019-07-18 DIAGNOSIS — E669 Obesity, unspecified: Secondary | ICD-10-CM

## 2019-07-18 DIAGNOSIS — A5901 Trichomonal vulvovaginitis: Secondary | ICD-10-CM

## 2019-07-18 DIAGNOSIS — O4703 False labor before 37 completed weeks of gestation, third trimester: Secondary | ICD-10-CM | POA: Diagnosis not present

## 2019-07-18 DIAGNOSIS — Z3A33 33 weeks gestation of pregnancy: Secondary | ICD-10-CM | POA: Diagnosis not present

## 2019-07-18 DIAGNOSIS — O23593 Infection of other part of genital tract in pregnancy, third trimester: Secondary | ICD-10-CM

## 2019-07-18 DIAGNOSIS — O99891 Other specified diseases and conditions complicating pregnancy: Secondary | ICD-10-CM | POA: Insufficient documentation

## 2019-07-18 DIAGNOSIS — Z3A34 34 weeks gestation of pregnancy: Secondary | ICD-10-CM | POA: Insufficient documentation

## 2019-07-18 DIAGNOSIS — R1084 Generalized abdominal pain: Secondary | ICD-10-CM | POA: Insufficient documentation

## 2019-07-18 LAB — URINALYSIS, ROUTINE W REFLEX MICROSCOPIC
Bilirubin Urine: NEGATIVE
Glucose, UA: NEGATIVE mg/dL
Hgb urine dipstick: NEGATIVE
Ketones, ur: 15 mg/dL — AB
Nitrite: NEGATIVE
Protein, ur: NEGATIVE mg/dL
Specific Gravity, Urine: 1.02 (ref 1.005–1.030)
pH: 6 (ref 5.0–8.0)

## 2019-07-18 LAB — CBC
HCT: 35.4 % — ABNORMAL LOW (ref 36.0–46.0)
Hemoglobin: 11.6 g/dL — ABNORMAL LOW (ref 12.0–15.0)
MCH: 28.8 pg (ref 26.0–34.0)
MCHC: 32.8 g/dL (ref 30.0–36.0)
MCV: 87.8 fL (ref 80.0–100.0)
Platelets: 303 10*3/uL (ref 150–400)
RBC: 4.03 MIL/uL (ref 3.87–5.11)
RDW: 13.1 % (ref 11.5–15.5)
WBC: 13 10*3/uL — ABNORMAL HIGH (ref 4.0–10.5)
nRBC: 0 % (ref 0.0–0.2)

## 2019-07-18 LAB — URINALYSIS, MICROSCOPIC (REFLEX)

## 2019-07-18 MED ORDER — NIFEDIPINE 10 MG PO CAPS
10.0000 mg | ORAL_CAPSULE | ORAL | Status: DC | PRN
  Administered 2019-07-18 (×2): 10 mg via ORAL
  Filled 2019-07-18 (×2): qty 1

## 2019-07-18 NOTE — ED Triage Notes (Signed)
Pt reports abd cramping and dizziness X1 day. Pt is [redacted] weeks pregnant. Report given to MAU, cleared by ED Provider.

## 2019-07-18 NOTE — MAU Provider Note (Signed)
History     CSN: [id number]  Arrival date and time: 07/18/19 1524   First Provider Initiated Contact with Patient 07/18/19 1722      Chief Complaint  Patient presents with  . Dizziness  . Abdominal Cramping  . Shortness of Breath  . Back Pain   Ruth Perry is a 29 y.o. G2P1 at [redacted]w[redacted]d who presents to MAU with complaints of contractions, back pain, dizziness and SOB. Patient reports contractions have been occurring since yesterday morning. Describes the contractions as tightening that radiates around to lower back. Reports contractions occur once every hour. Reports having 4 contractions throughout the day yesterday but today they are more frequent. Denies vaginal bleeding, discharge, LOF. Reports +FM. Patient reports one occurrence of dizziness in the morning after sitting down and going to stand up. She also reports SOB when laying down flat on her back when trying to sleep. She denies congestion, cough, sneezing.   Patient has a hx of Chlamydia during this pregnancy, reports completing azithromycin medication on 3/28. Patient reports she is taking Flagyl now and has 2-3 more days left.   OB History    Gravida  2   Para  1   Term  1   Preterm      AB      Living  1     SAB      TAB      Ectopic      Multiple      Live Births  1           Past Medical History:  Diagnosis Date  . Asthma    as a child    Past Surgical History:  Procedure Laterality Date  . CESAREAN SECTION    . CHOLECYSTECTOMY      Family History  Problem Relation Age of Onset  . Heart attack Mother     Social History   Tobacco Use  . Smoking status: Never Smoker  . Smokeless tobacco: Never Used  Substance Use Topics  . Alcohol use: Never  . Drug use: Never    Allergies: No Known Allergies  Medications Prior to Admission  Medication Sig Dispense Refill Last Dose  . aspirin EC 81 MG tablet Take 1 tablet (81 mg total) by mouth daily. Take after 12 weeks for prevention of  preeclampsia later in pregnancy 300 tablet 0 Past Week at Unknown time  . azithromycin (ZITHROMAX) 500 MG tablet Take 1gram at once with food. 2 tablet 2 Past Week at Unknown time  . metroNIDAZOLE (FLAGYL) 500 MG tablet Take 1 tablet (500 mg total) by mouth 2 (two) times daily. 14 tablet 2 07/18/2019 at Unknown time  . Prenatal MV-Min-FA-Omega-3 (PRENATAL GUMMIES/DHA & FA PO) Take by mouth.   07/18/2019 at Unknown time  . terconazole (TERAZOL 3) 0.8 % vaginal cream Place 1 applicator vaginally at bedtime. Apply nightly for three nights. 20 g 2 07/18/2019 at Unknown time  . albuterol (VENTOLIN HFA) 108 (90 Base) MCG/ACT inhaler Inhale 2 puffs into the lungs every 6 (six) hours as needed for wheezing or shortness of breath. 6.7 g 0 More than a month at Unknown time    Review of Systems  Constitutional: Negative.   HENT: Negative.   Respiratory: Positive for shortness of breath. Negative for cough.   Cardiovascular: Negative.   Gastrointestinal: Positive for abdominal pain. Negative for constipation, diarrhea, nausea and vomiting.  Genitourinary: Negative.   Musculoskeletal: Positive for back pain.  Neurological: Positive for dizziness. Negative for  syncope, light-headedness and headaches.   Physical Exam   Blood pressure 112/60, pulse 84, temperature 98.4 F (36.9 C), temperature source Oral, resp. rate 20, height 5\' 2"  (1.575 m), weight 105.7 kg, last menstrual period 12/06/2018, SpO2 98 %.  Physical Exam  Nursing note and vitals reviewed. Constitutional: She is oriented to person, place, and time. She appears well-developed and well-nourished. No distress.  Cardiovascular: Normal rate and regular rhythm.  Respiratory: Effort normal and breath sounds normal. No respiratory distress. She has no wheezes.  GI: Soft. There is no abdominal tenderness. There is no rebound and no guarding.  Gravid. Fetal movement palpated. Mild contractions palpated   Musculoskeletal:        General: No edema.  Normal range of motion.  Neurological: She is alert and oriented to person, place, and time.  Psychiatric: She has a normal mood and affect. Her behavior is normal. Thought content normal.   Initial cervical examination:  Dilation: Fingertip Effacement (%): Thick Cervical Position: Posterior Exam by:: [email or web address] CNM  MAU Course  Procedures  MDM Procardia x3 ordered and given for maternal comfort  Oral hydration d/t small amount of ketones in urine  NST   Initial NST non reactive- will send patient for BPP  BPP 8/8 Patient reports good fetal movement  Reassessment after ultrasound, patient reports pain is completely resolved  Cervix rechecked, unchanged NST reactive prior to discharge home FHR: 140/moderate/+accels/ 1 variable decel    Educated and discussed BH contractions, PTL precautions and reasons to present to MAU  Encouraged patient to increase amount of water she consumes to 6-8 bottles per day   Follow up as scheduled in the office- next prenatal appointment on Monday per patient. Return to MAU as needed. Pt stable at time of discharge.   Assessment and Plan   1. Preterm uterine contractions in third trimester, antepartum   5. [redacted] weeks gestation of pregnancy    Discharge home Follow up as scheduled in the office for prenatal care Return to MAU as needed for reasons discussed and/or emergencies  Hydration and FKC  Continue medication as prescribed   Follow-up Information    [facility] Follow up.   Specialty: Obstetrics and Gynecology Why: Follow up as scheduled for prenatal care and return to MAU as needed Contact information: [address], [address] [place] [phone]         Allergies as of 07/18/2019   No Known Allergies     Medication List    TAKE these medications   albuterol 108 (90 Base) MCG/ACT inhaler Commonly known as: VENTOLIN HFA Inhale 2 puffs into the lungs every 6 (six)  hours as needed for wheezing or shortness of breath.   aspirin EC 81 MG tablet Take 1 tablet (81 mg total) by mouth daily. Take after 12 weeks for prevention of preeclampsia later in pregnancy   azithromycin 500 MG tablet Commonly known as: ZITHROMAX Take 1gram at once with food.   metroNIDAZOLE 500 MG tablet Commonly known as: FLAGYL Take 1 tablet (500 mg total) by mouth 2 (two) times daily.   PRENATAL GUMMIES/DHA & FA PO Take by mouth.   terconazole 0.8 % vaginal cream Commonly known as: TERAZOL 3 Place 1 applicator vaginally at bedtime. Apply nightly for three nights.        09/17/2019 CNM 07/18/2019, 8:08 PM

## 2019-07-18 NOTE — ED Provider Notes (Signed)
MSE was initiated and I personally evaluated the patient and placed orders (if any) at  3:34 PM on July 18, 2019.  The patient appears stable so that the remainder of the MSE may be completed by another provider.  Patient will need assessment of the MAU as she is [redacted] weeks pregnant and is having tightness and cramping in her abdomen.   Charlestine Night, PA-C 07/18/19 1534    Pricilla Loveless, MD 07/18/19 [phone]

## 2019-07-18 NOTE — MAU Note (Signed)
Came up from ER.  Stomach has been hurting, cramping and tightening.  Pain in low back, both started yesterday.  Has been dizzy.  At times feels like she can't catch her breath.

## 2019-07-22 ENCOUNTER — Other Ambulatory Visit: Payer: Self-pay

## 2019-07-22 ENCOUNTER — Ambulatory Visit (INDEPENDENT_AMBULATORY_CARE_PROVIDER_SITE_OTHER): Payer: Medicaid Other | Admitting: Obstetrics & Gynecology

## 2019-07-22 ENCOUNTER — Encounter: Payer: Self-pay | Admitting: Obstetrics & Gynecology

## 2019-07-22 VITALS — BP 106/68 | HR 85 | Wt 229.9 lb

## 2019-07-22 DIAGNOSIS — E669 Obesity, unspecified: Secondary | ICD-10-CM

## 2019-07-22 DIAGNOSIS — A749 Chlamydial infection, unspecified: Secondary | ICD-10-CM

## 2019-07-22 DIAGNOSIS — O99210 Obesity complicating pregnancy, unspecified trimester: Secondary | ICD-10-CM

## 2019-07-22 DIAGNOSIS — O99213 Obesity complicating pregnancy, third trimester: Secondary | ICD-10-CM

## 2019-07-22 DIAGNOSIS — Z3A34 34 weeks gestation of pregnancy: Secondary | ICD-10-CM

## 2019-07-22 DIAGNOSIS — O98313 Other infections with a predominantly sexual mode of transmission complicating pregnancy, third trimester: Secondary | ICD-10-CM

## 2019-07-22 DIAGNOSIS — Z3480 Encounter for supervision of other normal pregnancy, unspecified trimester: Secondary | ICD-10-CM

## 2019-07-22 NOTE — Progress Notes (Signed)
   PRENATAL VISIT NOTE  Subjective:  Ruth Perry is a 29 y.o. G2P1001 at [redacted]w[redacted]d being seen today for ongoing prenatal care.  She is currently monitored for the following issues for this high-risk pregnancy and has Bleeding in early pregnancy; Trichomonal vaginitis during pregnancy; Supervision of other normal pregnancy, antepartum; History of cesarean delivery, currently pregnant; Obesity during pregnancy, antepartum; and Chlamydia infection affecting pregnancy on their problem list.  Patient reports no complaints.  Contractions: Irritability. Vag. Bleeding: None.  Movement: Present. Denies leaking of fluid.   The following portions of the patient's history were reviewed and updated as appropriate: allergies, current medications, past family history, past medical history, past social history, past surgical history and problem list.   Objective:   Vitals:   07/22/19 0951  BP: 106/68  Pulse: 85  Weight: 229 lb 14.4 oz (104.3 kg)    Fetal Status: Fetal Heart Rate (bpm): 138   Movement: Present     General:  Alert, oriented and cooperative. Patient is in no acute distress.  Skin: Skin is warm and dry. No rash noted.   Cardiovascular: Normal heart rate noted  Respiratory: Normal respiratory effort, no problems with respiration noted  Abdomen: Soft, gravid, appropriate for gestational age.  Pain/Pressure: Absent     Pelvic: Cervical exam deferred        Extremities: Normal range of motion.  Edema: None  Mental Status: Normal mood and affect. Normal behavior. Normal judgment and thought content.   Assessment and Plan:  Pregnancy: G2P1001 at [redacted]w[redacted]d 1. Supervision of other normal pregnancy, antepartum H/o CS for 2nd stage arrest, pushed for 40 min, no fetal distress per her recollection TOLAC consent signed 2. Chlamydia infection affecting pregnancy in third trimester S/p treatment, TOC in 2 weeks  3. Obesity during pregnancy, antepartum Nl growth by [place]  Preterm labor symptoms and  general obstetric precautions including but not limited to vaginal bleeding, contractions, leaking of fluid and fetal movement were reviewed in detail with the patient. Please refer to After Visit Summary for other counseling recommendations.   Return in about 2 weeks (around 08/05/2019) for GBS.  Future Appointments  Date Time Provider Department Center  08/05/2019 10:30 AM Constant, Gigi Gin, MD [facility] None  08/09/2019  1:15 PM [facility] NURSE [facility]  08/09/2019  1:15 PM [facility] [place] 4 [facility]    Scheryl Darter, MD

## 2019-07-22 NOTE — Progress Notes (Signed)
VBAC signed.  Reports no problems today.

## 2019-07-22 NOTE — Patient Instructions (Signed)
Vaginal Birth After Cesarean Delivery  Vaginal birth after cesarean delivery (VBAC) is giving birth vaginally after previously delivering a baby through a cesarean section (C-section). A VBAC may be a safe option for you, depending on your health and other factors. It is important to discuss VBAC with your health care provider early in your pregnancy so you can understand the risks, benefits, and options. Having these discussions early will give you time to make your birth plan. Who are the best candidates for VBAC? The best candidates for VBAC are women who:  Have had one or two prior cesarean deliveries, and the incision made during the delivery was horizontal (low transverse).  Do not have a vertical (classical) scar on their uterus.  Have not had a tear in the wall of their uterus (uterine rupture).  Plan to have more pregnancies. A VBAC is also more likely to be successful:  In women who have previously given birth vaginally.  When labor starts by itself (spontaneously) before the due date. What are the benefits of VBAC? The benefits of delivering your baby vaginally instead of by a cesarean delivery include:  A shorter hospital stay.  A faster recovery time.  Less pain.  Avoiding risks associated with major surgery, such as infection and blood clots.  Less blood loss and less need for donated blood (transfusions). What are the risks of VBAC? The main risk of attempting a VBAC is that it may fail, forcing your health care provider to deliver your baby by a C-section. Other risks are rare and include:  Tearing (rupture) of the scar from a past cesarean delivery.  Other risks associated with vaginal deliveries. If a repeat cesarean delivery is needed, the risks include:  Blood loss.  Infection.  Blood clot.  Damage to surrounding organs.  Removal of the uterus (hysterectomy), if it is damaged.  Placenta problems in future pregnancies. What else should I know  about my options? Delivering a baby through a VBAC is similar to having a normal spontaneous vaginal delivery. Therefore, it is safe:  To try with twins.  For your health care provider to try to turn the baby from a breech position (external cephalic version) during labor.  With epidural analgesia for pain relief. Consider where you would like to deliver your baby. VBAC should be attempted in facilities where an emergency cesarean delivery can be performed. VBAC is not recommended for home births. Any changes in your health or your baby's health during your pregnancy may make it necessary to change your initial decision about VBAC. Your health care provider may recommend that you do not attempt a VBAC if:  Your baby's suspected weight is 8.8 lb (4 kg) or more.  You have preeclampsia. This is a condition that causes high blood pressure along with other symptoms, such as swelling and headaches.  You will have VBAC less than 19 months after your cesarean delivery.  You are past your due date.  You need to have labor started (induced) because your cervix is not ready for labor (unfavorable). Where to find more information  American Pregnancy Association: americanpregnancy.org  American Congress of Obstetricians and Gynecologists: acog.org Summary  Vaginal birth after cesarean delivery (VBAC) is giving birth vaginally after previously delivering a baby through a cesarean section (C-section). A VBAC may be a safe option for you, depending on your health and other factors.  Discuss VBAC with your health care provider early in your pregnancy so you can understand the risks, benefits, options, and   have plenty of time to make your birth plan.  The main risk of attempting a VBAC is that it may fail, forcing your health care provider to deliver your baby by a C-section. Other risks are rare. This information is not intended to replace advice given to you by your health care provider. Make sure  you discuss any questions you have with your health care provider. Document Revised: 07/31/2018 Document Reviewed: 07/12/2016 Elsevier Patient Education  2020 Elsevier Inc.  

## 2019-08-05 ENCOUNTER — Encounter: Payer: Medicaid Other | Admitting: Obstetrics and Gynecology

## 2019-08-09 ENCOUNTER — Ambulatory Visit (HOSPITAL_COMMUNITY): Admission: RE | Admit: 2019-08-09 | Payer: Medicaid Other | Source: Ambulatory Visit

## 2019-08-09 ENCOUNTER — Encounter (HOSPITAL_COMMUNITY): Payer: Self-pay

## 2019-08-09 ENCOUNTER — Ambulatory Visit (HOSPITAL_COMMUNITY)
Admission: RE | Admit: 2019-08-09 | Discharge: 2019-08-09 | Disposition: A | Discharge status: Home / Self Care | Payer: Medicaid Other | Source: Ambulatory Visit | Attending: Obstetrics | Admitting: Obstetrics

## 2019-08-09 ENCOUNTER — Ambulatory Visit (HOSPITAL_COMMUNITY): Payer: Medicaid Other | Admitting: *Deleted

## 2019-08-09 ENCOUNTER — Other Ambulatory Visit: Payer: Self-pay

## 2019-08-09 ENCOUNTER — Ambulatory Visit (HOSPITAL_COMMUNITY): Payer: Medicaid Other

## 2019-08-09 DIAGNOSIS — Z3480 Encounter for supervision of other normal pregnancy, unspecified trimester: Secondary | ICD-10-CM | POA: Diagnosis present

## 2019-08-09 DIAGNOSIS — O0933 Supervision of pregnancy with insufficient antenatal care, third trimester: Secondary | ICD-10-CM

## 2019-08-09 DIAGNOSIS — O23593 Infection of other part of genital tract in pregnancy, third trimester: Secondary | ICD-10-CM

## 2019-08-09 DIAGNOSIS — Z3A36 36 weeks gestation of pregnancy: Secondary | ICD-10-CM

## 2019-08-09 DIAGNOSIS — O289 Unspecified abnormal findings on antenatal screening of mother: Secondary | ICD-10-CM

## 2019-08-09 DIAGNOSIS — O99213 Obesity complicating pregnancy, third trimester: Secondary | ICD-10-CM | POA: Diagnosis not present

## 2019-08-09 DIAGNOSIS — O209 Hemorrhage in early pregnancy, unspecified: Secondary | ICD-10-CM | POA: Insufficient documentation

## 2019-08-09 DIAGNOSIS — A5901 Trichomonal vulvovaginitis: Secondary | ICD-10-CM | POA: Diagnosis present

## 2019-08-09 DIAGNOSIS — Z362 Encounter for other antenatal screening follow-up: Secondary | ICD-10-CM | POA: Diagnosis not present

## 2019-08-09 DIAGNOSIS — E669 Obesity, unspecified: Secondary | ICD-10-CM

## 2019-08-20 ENCOUNTER — Inpatient Hospital Stay (HOSPITAL_COMMUNITY)
Admission: AD | Admit: 2019-08-20 | Discharge: 2019-08-20 | Disposition: A | Discharge status: Home / Self Care | Payer: Medicaid Other | Attending: Obstetrics and Gynecology | Admitting: Obstetrics and Gynecology

## 2019-08-20 ENCOUNTER — Other Ambulatory Visit: Payer: Self-pay

## 2019-08-20 ENCOUNTER — Encounter (HOSPITAL_COMMUNITY): Payer: Self-pay | Admitting: Obstetrics and Gynecology

## 2019-08-20 DIAGNOSIS — Z3A38 38 weeks gestation of pregnancy: Secondary | ICD-10-CM | POA: Diagnosis not present

## 2019-08-20 DIAGNOSIS — O209 Hemorrhage in early pregnancy, unspecified: Secondary | ICD-10-CM

## 2019-08-20 DIAGNOSIS — O23593 Infection of other part of genital tract in pregnancy, third trimester: Secondary | ICD-10-CM

## 2019-08-20 DIAGNOSIS — O471 False labor at or after 37 completed weeks of gestation: Secondary | ICD-10-CM | POA: Insufficient documentation

## 2019-08-20 DIAGNOSIS — Z3480 Encounter for supervision of other normal pregnancy, unspecified trimester: Secondary | ICD-10-CM

## 2019-08-20 DIAGNOSIS — A5901 Trichomonal vulvovaginitis: Secondary | ICD-10-CM

## 2019-08-20 NOTE — MAU Note (Signed)
I have communicated with Philipp Deputy CNM and reviewed vital signs:  Vitals:   08/20/19 1454 08/20/19 1609  BP: 127/74 (!) 120/54  Pulse: 96   Resp: 20   Temp: 98.1 F (36.7 C)   SpO2: 98%     Vaginal exam:  Dilation: Fingertip Cervical Position: Anterior Presentation: Undeterminable Exam by:: Fabian November CNM,   Also reviewed contraction pattern and that non-stress test is reactive.  It has been documented that patient is contracting irregularly and is fingertip, not indicating active labor.  Patient denies any other complaints.  Based on this report provider has given order for discharge.  A discharge order and diagnosis entered by a provider.   Labor discharge instructions reviewed with patient.

## 2019-08-20 NOTE — MAU Note (Signed)
Started last night, still very infrequent, every 30-46min. No bleeding or leaking.

## 2019-08-20 NOTE — MAU Provider Note (Signed)
   S: Ms. Ruth Perry is a 29 y.o. G2P1001 at [redacted]w[redacted]d  who presents to MAU today complaining contractions  today. She denies vaginal bleeding. She denies LOF. She reports normal fetal movement.    O: BP 127/74 (BP Location: Right Arm)   Pulse 96   Temp 98.1 F (36.7 C) (Oral)   Resp 20   Ht 5\' 2"  (1.575 m)   Wt 105.6 kg   SpO2 98%   BMI 42.58 kg/m  GENERAL: Well-developed, well-nourished female in no acute distress.  HEAD: Normocephalic, atraumatic.  CHEST: Normal effort of breathing, regular heart rate ABDOMEN: Soft, nontender, gravid  Cervical exam:  Dilation: Fingertip Cervical Position: Anterior Presentation: Undeterminable Exam by:: [email or web address] CNM   Fetal Monitoring: Baseline: 140s Variability: avg LTV Accelerations: + Decelerations: none Contractions: irreg, UI   A: SIUP at [redacted]w[redacted]d  False labor  P: D/C home with labor precautions Msg sent to [facility] to schedule a ROB as she hasn't been to the office since 34wks GBS/GC/chlam collected (TOC for +chlamydia on 07/08/19)  07/10/19, CNM 08/20/2019 4:10 PM

## 2019-08-21 ENCOUNTER — Other Ambulatory Visit: Payer: Self-pay | Admitting: Advanced Practice Midwife

## 2019-08-21 DIAGNOSIS — O98813 Other maternal infectious and parasitic diseases complicating pregnancy, third trimester: Secondary | ICD-10-CM

## 2019-08-21 DIAGNOSIS — A749 Chlamydial infection, unspecified: Secondary | ICD-10-CM

## 2019-08-21 LAB — GC/CHLAMYDIA PROBE AMP (~~LOC~~) NOT AT ARMC
Chlamydia: POSITIVE — AB
Comment: NEGATIVE
Comment: NORMAL
Neisseria Gonorrhea: NEGATIVE

## 2019-08-21 MED ORDER — AZITHROMYCIN 500 MG PO TABS: ORAL_TABLET | ORAL | 2 refills | Status: DC

## 2019-08-22 LAB — OB RESULTS CONSOLE GBS: GBS: NEGATIVE

## 2019-08-22 LAB — CULTURE, BETA STREP (GROUP B ONLY)

## 2019-08-27 ENCOUNTER — Other Ambulatory Visit: Payer: Self-pay

## 2019-08-27 ENCOUNTER — Ambulatory Visit (INDEPENDENT_AMBULATORY_CARE_PROVIDER_SITE_OTHER): Payer: Medicaid Other | Admitting: Obstetrics and Gynecology

## 2019-08-27 ENCOUNTER — Encounter: Payer: Self-pay | Admitting: Obstetrics and Gynecology

## 2019-08-27 VITALS — BP 103/68 | HR 92 | Wt 237.0 lb

## 2019-08-27 DIAGNOSIS — O98813 Other maternal infectious and parasitic diseases complicating pregnancy, third trimester: Secondary | ICD-10-CM

## 2019-08-27 DIAGNOSIS — Z3480 Encounter for supervision of other normal pregnancy, unspecified trimester: Secondary | ICD-10-CM

## 2019-08-27 DIAGNOSIS — O34219 Maternal care for unspecified type scar from previous cesarean delivery: Secondary | ICD-10-CM

## 2019-08-27 DIAGNOSIS — O99210 Obesity complicating pregnancy, unspecified trimester: Secondary | ICD-10-CM

## 2019-08-27 DIAGNOSIS — A749 Chlamydial infection, unspecified: Secondary | ICD-10-CM

## 2019-08-27 NOTE — Progress Notes (Signed)
Induction Assessment Scheduling Form: [facility]ax to [facility]:  [phone] Route to [facility] Labor Delivery   Keni Wafer                                                                                   DOB:  1990-09-27                                                            MRN:  [id number]  Phone:  Home Phone [phone]  Mobile [phone]    Provider:  [facility] (Faculty Practice)  Admission Date/Time:  09/07/19 GP:  [id number]     Gestational age on admission:  57                                                Estimated Date of Delivery: 08/31/19  Dating Criteria: [place] at 32 weeks  There were no vitals filed for this visit.  GBS:  NEGATIVE HIV:  Non Reactive (03/22 1049)  Medical Indications for induction:  postdate Scheduling Provider Signature:  Maretta Bees, RMA         Method of induction(proposed):  Cytotec   Scheduling Provider Signature:  Maretta Bees, RMA                                            Today's Date:  08/27/2019

## 2019-08-27 NOTE — Progress Notes (Signed)
   PRENATAL VISIT NOTE  Subjective:  Ruth Perry is a 29 y.o. G2P1001 at [redacted]w[redacted]d being seen today for ongoing prenatal care.  She is currently monitored for the following issues for this low-risk pregnancy and has Bleeding in early pregnancy; Trichomonal vaginitis during pregnancy; Supervision of other normal pregnancy, antepartum; History of cesarean delivery, currently pregnant; Obesity during pregnancy, antepartum; and Chlamydia infection affecting pregnancy on their problem list.  Patient reports no complaints.  Contractions: Irregular. Vag. Bleeding: None.  Movement: Present. Denies leaking of fluid.   The following portions of the patient's history were reviewed and updated as appropriate: allergies, current medications, past family history, past medical history, past social history, past surgical history and problem list.   Objective:   Vitals:   08/27/19 1512  BP: 103/68  Pulse: 92  Weight: 237 lb (107.5 kg)    Fetal Status: Fetal Heart Rate (bpm): 140   Movement: Present     General:  Alert, oriented and cooperative. Patient is in no acute distress.  Skin: Skin is warm and dry. No rash noted.   Cardiovascular: Normal heart rate noted  Respiratory: Normal respiratory effort, no problems with respiration noted  Abdomen: Soft, gravid, appropriate for gestational age.  Pain/Pressure: Present     Pelvic: Cervical exam deferred        Extremities: Normal range of motion.  Edema: Trace  Mental Status: Normal mood and affect. Normal behavior. Normal judgment and thought content.   Assessment and Plan:  Pregnancy: G2P1001 at [redacted]w[redacted]d 1. Supervision of other normal pregnancy, antepartum Patient is doing well without complaints  Patient plans depo-provera vs nexplanon Postdate testing next visit IOL at 41 weeks  2. Obesity during pregnancy, antepartum Continue ASA  3. History of cesarean delivery, currently pregnant Patient desires TOLAC  4. Chlamydia infection affecting  pregnancy in third trimester Positive on 5/4. Patient denies any recent sexual contact with partner. She is picking up treatment dose today  Term labor symptoms and general obstetric precautions including but not limited to vaginal bleeding, contractions, leaking of fluid and fetal movement were reviewed in detail with the patient. Please refer to After Visit Summary for other counseling recommendations.   Return in about 1 week (around 09/03/2019) for in person, ROB, Low risk, NST.  No future appointments.  Catalina Antigua, MD

## 2019-08-28 ENCOUNTER — Telehealth (HOSPITAL_COMMUNITY): Payer: Self-pay | Admitting: *Deleted

## 2019-08-28 NOTE — Telephone Encounter (Signed)
Preadmission screen  

## 2019-08-29 ENCOUNTER — Encounter (HOSPITAL_COMMUNITY): Payer: Self-pay | Admitting: *Deleted

## 2019-08-29 NOTE — Telephone Encounter (Signed)
Preadmission screen  

## 2019-09-01 ENCOUNTER — Other Ambulatory Visit: Payer: Self-pay

## 2019-09-01 ENCOUNTER — Encounter (HOSPITAL_COMMUNITY): Payer: Self-pay | Admitting: Obstetrics and Gynecology

## 2019-09-01 ENCOUNTER — Encounter (HOSPITAL_COMMUNITY)
Admission: AD | Disposition: A | Discharge status: Home / Self Care | Payer: Self-pay | Source: Home / Self Care | Attending: Obstetrics and Gynecology

## 2019-09-01 ENCOUNTER — Inpatient Hospital Stay (HOSPITAL_COMMUNITY)
Admission: AD | Admit: 2019-09-01 | Discharge: 2019-09-04 | DRG: 787 | Disposition: A | Discharge status: Home / Self Care | Payer: Medicaid Other | Attending: Obstetrics and Gynecology | Admitting: Obstetrics and Gynecology

## 2019-09-01 ENCOUNTER — Inpatient Hospital Stay (HOSPITAL_COMMUNITY): Payer: Medicaid Other | Admitting: Anesthesiology

## 2019-09-01 DIAGNOSIS — O480 Post-term pregnancy: Secondary | ICD-10-CM | POA: Diagnosis present

## 2019-09-01 DIAGNOSIS — Z20822 Contact with and (suspected) exposure to covid-19: Secondary | ICD-10-CM | POA: Diagnosis present

## 2019-09-01 DIAGNOSIS — Z3A40 40 weeks gestation of pregnancy: Secondary | ICD-10-CM

## 2019-09-01 DIAGNOSIS — O99214 Obesity complicating childbirth: Secondary | ICD-10-CM | POA: Diagnosis present

## 2019-09-01 DIAGNOSIS — O324XX0 Maternal care for high head at term, not applicable or unspecified: Secondary | ICD-10-CM

## 2019-09-01 DIAGNOSIS — Z3490 Encounter for supervision of normal pregnancy, unspecified, unspecified trimester: Secondary | ICD-10-CM | POA: Diagnosis present

## 2019-09-01 DIAGNOSIS — O9832 Other infections with a predominantly sexual mode of transmission complicating childbirth: Secondary | ICD-10-CM | POA: Diagnosis present

## 2019-09-01 DIAGNOSIS — O209 Hemorrhage in early pregnancy, unspecified: Secondary | ICD-10-CM

## 2019-09-01 DIAGNOSIS — O34211 Maternal care for low transverse scar from previous cesarean delivery: Secondary | ICD-10-CM | POA: Diagnosis present

## 2019-09-01 DIAGNOSIS — O34219 Maternal care for unspecified type scar from previous cesarean delivery: Secondary | ICD-10-CM

## 2019-09-01 DIAGNOSIS — E669 Obesity, unspecified: Secondary | ICD-10-CM | POA: Diagnosis present

## 2019-09-01 DIAGNOSIS — Z8249 Family history of ischemic heart disease and other diseases of the circulatory system: Secondary | ICD-10-CM

## 2019-09-01 DIAGNOSIS — O23593 Infection of other part of genital tract in pregnancy, third trimester: Secondary | ICD-10-CM

## 2019-09-01 DIAGNOSIS — Z98891 History of uterine scar from previous surgery: Secondary | ICD-10-CM | POA: Diagnosis present

## 2019-09-01 DIAGNOSIS — O23599 Infection of other part of genital tract in pregnancy, unspecified trimester: Secondary | ICD-10-CM | POA: Diagnosis present

## 2019-09-01 DIAGNOSIS — A749 Chlamydial infection, unspecified: Secondary | ICD-10-CM | POA: Diagnosis present

## 2019-09-01 DIAGNOSIS — O99210 Obesity complicating pregnancy, unspecified trimester: Secondary | ICD-10-CM | POA: Diagnosis present

## 2019-09-01 DIAGNOSIS — A5901 Trichomonal vulvovaginitis: Secondary | ICD-10-CM | POA: Diagnosis present

## 2019-09-01 DIAGNOSIS — Z3480 Encounter for supervision of other normal pregnancy, unspecified trimester: Secondary | ICD-10-CM

## 2019-09-01 LAB — SARS CORONAVIRUS 2 BY RT PCR (HOSPITAL ORDER, PERFORMED IN ~~LOC~~ HOSPITAL LAB): SARS Coronavirus 2: NEGATIVE

## 2019-09-01 LAB — CBC
HCT: 39.3 % (ref 36.0–46.0)
Hemoglobin: 12.9 g/dL (ref 12.0–15.0)
MCH: 28 pg (ref 26.0–34.0)
MCHC: 32.8 g/dL (ref 30.0–36.0)
MCV: 85.4 fL (ref 80.0–100.0)
Platelets: 252 10*3/uL (ref 150–400)
RBC: 4.6 MIL/uL (ref 3.87–5.11)
RDW: 14.3 % (ref 11.5–15.5)
WBC: 13.2 10*3/uL — ABNORMAL HIGH (ref 4.0–10.5)
nRBC: 0 % (ref 0.0–0.2)

## 2019-09-01 SURGERY — Surgical Case
Anesthesia: Epidural | Wound class: Clean Contaminated

## 2019-09-01 MED ORDER — DIPHENHYDRAMINE HCL 50 MG/ML IJ SOLN: 12.5000 mg | INTRAMUSCULAR | Status: DC | PRN

## 2019-09-01 MED ORDER — LIDOCAINE HCL (PF) 1 % IJ SOLN: 30.0000 mL | INTRAMUSCULAR | Status: DC | PRN

## 2019-09-01 MED ORDER — ONDANSETRON HCL 4 MG/2ML IJ SOLN
INTRAMUSCULAR | Status: DC | PRN
  Administered 2019-09-01: 4 mg via INTRAVENOUS

## 2019-09-01 MED ORDER — LACTATED RINGERS IV SOLN: 500.0000 mL | Freq: Once | INTRAVENOUS | Status: DC

## 2019-09-01 MED ORDER — SODIUM CHLORIDE (PF) 0.9 % IJ SOLN
INTRAMUSCULAR | Status: DC | PRN
  Administered 2019-09-01: 11 mL/h via EPIDURAL

## 2019-09-01 MED ORDER — MORPHINE SULFATE (PF) 0.5 MG/ML IJ SOLN
INTRAMUSCULAR | Status: DC | PRN
  Administered 2019-09-01: 3 mg via EPIDURAL

## 2019-09-01 MED ORDER — NALBUPHINE HCL 10 MG/ML IJ SOLN: 5.0000 mg | INTRAMUSCULAR | Status: DC | PRN

## 2019-09-01 MED ORDER — ENOXAPARIN SODIUM 60 MG/0.6ML ~~LOC~~ SOLN
0.5000 mg/kg | SUBCUTANEOUS | Status: DC
  Administered 2019-09-02 – 2019-09-03 (×2): 55 mg via SUBCUTANEOUS
  Filled 2019-09-01 (×2): qty 0.6

## 2019-09-01 MED ORDER — OXYTOCIN 40 UNITS IN NORMAL SALINE INFUSION - SIMPLE MED
INTRAVENOUS | Status: AC
  Filled 2019-09-01: qty 1000

## 2019-09-01 MED ORDER — ACETAMINOPHEN 325 MG PO TABS: 650.0000 mg | ORAL_TABLET | ORAL | Status: DC | PRN

## 2019-09-01 MED ORDER — SIMETHICONE 80 MG PO CHEW
80.0000 mg | CHEWABLE_TABLET | Freq: Three times a day (TID) | ORAL | Status: DC
  Administered 2019-09-02 – 2019-09-04 (×7): 80 mg via ORAL
  Filled 2019-09-01 (×7): qty 1

## 2019-09-01 MED ORDER — DIPHENHYDRAMINE HCL 25 MG PO CAPS: 25.0000 mg | ORAL_CAPSULE | Freq: Four times a day (QID) | ORAL | Status: DC | PRN

## 2019-09-01 MED ORDER — OXYTOCIN 40 UNITS IN NORMAL SALINE INFUSION - SIMPLE MED
INTRAVENOUS | Status: DC | PRN
  Administered 2019-09-01: 500 mL via INTRAVENOUS

## 2019-09-01 MED ORDER — ONDANSETRON HCL 4 MG/2ML IJ SOLN: 4.0000 mg | Freq: Four times a day (QID) | INTRAMUSCULAR | Status: DC | PRN

## 2019-09-01 MED ORDER — NALOXONE HCL 0.4 MG/ML IJ SOLN: 0.4000 mg | INTRAMUSCULAR | Status: DC | PRN

## 2019-09-01 MED ORDER — DEXAMETHASONE SODIUM PHOSPHATE 10 MG/ML IJ SOLN
INTRAMUSCULAR | Status: AC
  Filled 2019-09-01: qty 1

## 2019-09-01 MED ORDER — ACETAMINOPHEN 325 MG PO TABS
650.0000 mg | ORAL_TABLET | Freq: Four times a day (QID) | ORAL | Status: DC | PRN
  Administered 2019-09-03 – 2019-09-04 (×4): 650 mg via ORAL
  Filled 2019-09-01 (×3): qty 2

## 2019-09-01 MED ORDER — LACTATED RINGERS IV SOLN: INTRAVENOUS | Status: DC | PRN

## 2019-09-01 MED ORDER — ALBUTEROL SULFATE (2.5 MG/3ML) 0.083% IN NEBU: 3.0000 mL | INHALATION_SOLUTION | Freq: Four times a day (QID) | RESPIRATORY_TRACT | Status: DC | PRN

## 2019-09-01 MED ORDER — EPHEDRINE 5 MG/ML INJ: 10.0000 mg | INTRAVENOUS | Status: DC | PRN

## 2019-09-01 MED ORDER — ONDANSETRON HCL 4 MG/2ML IJ SOLN
INTRAMUSCULAR | Status: AC
  Filled 2019-09-01: qty 2

## 2019-09-01 MED ORDER — MEDROXYPROGESTERONE ACETATE 150 MG/ML IM SUSP
150.0000 mg | Freq: Once | INTRAMUSCULAR | Status: AC
  Administered 2019-09-03: 150 mg via INTRAMUSCULAR
  Filled 2019-09-01: qty 1

## 2019-09-01 MED ORDER — ZOLPIDEM TARTRATE 5 MG PO TABS: 5.0000 mg | ORAL_TABLET | Freq: Every evening | ORAL | Status: DC | PRN

## 2019-09-01 MED ORDER — SENNOSIDES-DOCUSATE SODIUM 8.6-50 MG PO TABS
2.0000 | ORAL_TABLET | ORAL | Status: DC
  Administered 2019-09-02 – 2019-09-03 (×3): 2 via ORAL
  Filled 2019-09-01 (×3): qty 2

## 2019-09-01 MED ORDER — LIDOCAINE-EPINEPHRINE (PF) 2 %-1:200000 IJ SOLN
INTRAMUSCULAR | Status: AC
  Filled 2019-09-01: qty 10

## 2019-09-01 MED ORDER — PHENYLEPHRINE 40 MCG/ML (10ML) SYRINGE FOR IV PUSH (FOR BLOOD PRESSURE SUPPORT)
80.0000 ug | PREFILLED_SYRINGE | INTRAVENOUS | Status: DC | PRN
  Filled 2019-09-01: qty 10

## 2019-09-01 MED ORDER — DIPHENHYDRAMINE HCL 25 MG PO CAPS: 25.0000 mg | ORAL_CAPSULE | ORAL | Status: DC | PRN

## 2019-09-01 MED ORDER — NALOXONE HCL 4 MG/10ML IJ SOLN
1.0000 ug/kg/h | INTRAVENOUS | Status: DC | PRN
  Filled 2019-09-01: qty 5

## 2019-09-01 MED ORDER — CEFAZOLIN SODIUM-DEXTROSE 2-4 GM/100ML-% IV SOLN
INTRAVENOUS | Status: AC
  Filled 2019-09-01: qty 100

## 2019-09-01 MED ORDER — SODIUM CHLORIDE 0.9% FLUSH: 3.0000 mL | INTRAVENOUS | Status: DC | PRN

## 2019-09-01 MED ORDER — DEXAMETHASONE SODIUM PHOSPHATE 10 MG/ML IJ SOLN
INTRAMUSCULAR | Status: DC | PRN
Start: 2019-09-01 — End: 2019-09-01
  Administered 2019-09-01: 10 mg via INTRAVENOUS

## 2019-09-01 MED ORDER — IBUPROFEN 800 MG PO TABS
800.0000 mg | ORAL_TABLET | Freq: Four times a day (QID) | ORAL | Status: DC
  Administered 2019-09-03 – 2019-09-04 (×7): 800 mg via ORAL
  Filled 2019-09-01 (×7): qty 1

## 2019-09-01 MED ORDER — FENTANYL-BUPIVACAINE-NACL 0.5-0.125-0.9 MG/250ML-% EP SOLN
12.0000 mL/h | EPIDURAL | Status: DC | PRN
  Filled 2019-09-01: qty 250

## 2019-09-01 MED ORDER — KETOROLAC TROMETHAMINE 30 MG/ML IJ SOLN
INTRAMUSCULAR | Status: AC
  Filled 2019-09-01: qty 1

## 2019-09-01 MED ORDER — NALBUPHINE HCL 10 MG/ML IJ SOLN: 5.0000 mg | Freq: Once | INTRAMUSCULAR | Status: DC | PRN

## 2019-09-01 MED ORDER — KETOROLAC TROMETHAMINE 30 MG/ML IJ SOLN
30.0000 mg | Freq: Four times a day (QID) | INTRAMUSCULAR | Status: AC
  Administered 2019-09-02 (×3): 30 mg via INTRAVENOUS
  Filled 2019-09-01 (×3): qty 1

## 2019-09-01 MED ORDER — TETANUS-DIPHTH-ACELL PERTUSSIS 5-2.5-18.5 LF-MCG/0.5 IM SUSP: 0.5000 mL | Freq: Once | INTRAMUSCULAR | Status: DC

## 2019-09-01 MED ORDER — WITCH HAZEL-GLYCERIN EX PADS: 1.0000 "application " | MEDICATED_PAD | CUTANEOUS | Status: DC | PRN

## 2019-09-01 MED ORDER — FENTANYL CITRATE (PF) 100 MCG/2ML IJ SOLN: 25.0000 ug | INTRAMUSCULAR | Status: DC | PRN

## 2019-09-01 MED ORDER — MENTHOL 3 MG MT LOZG: 1.0000 | LOZENGE | OROMUCOSAL | Status: DC | PRN

## 2019-09-01 MED ORDER — SODIUM CHLORIDE 0.9 % IR SOLN
Status: DC | PRN
  Administered 2019-09-01: 1000 mL

## 2019-09-01 MED ORDER — OXYTOCIN 40 UNITS IN NORMAL SALINE INFUSION - SIMPLE MED: 2.5000 [IU]/h | INTRAVENOUS | Status: DC

## 2019-09-01 MED ORDER — SODIUM BICARBONATE 8.4 % IV SOLN
INTRAVENOUS | Status: DC | PRN
  Administered 2019-09-01 (×2): 5 mL via EPIDURAL

## 2019-09-01 MED ORDER — SIMETHICONE 80 MG PO CHEW: 80.0000 mg | CHEWABLE_TABLET | ORAL | Status: DC | PRN

## 2019-09-01 MED ORDER — PHENYLEPHRINE 40 MCG/ML (10ML) SYRINGE FOR IV PUSH (FOR BLOOD PRESSURE SUPPORT): 80.0000 ug | PREFILLED_SYRINGE | INTRAVENOUS | Status: DC | PRN

## 2019-09-01 MED ORDER — MEPERIDINE HCL 25 MG/ML IJ SOLN: 6.2500 mg | INTRAMUSCULAR | Status: DC | PRN

## 2019-09-01 MED ORDER — KETOROLAC TROMETHAMINE 30 MG/ML IJ SOLN
30.0000 mg | Freq: Four times a day (QID) | INTRAMUSCULAR | Status: AC | PRN
  Administered 2019-09-01: 30 mg via INTRAVENOUS

## 2019-09-01 MED ORDER — LACTATED RINGERS IV SOLN
500.0000 mL | INTRAVENOUS | Status: DC | PRN
  Administered 2019-09-01: 500 mL via INTRAVENOUS

## 2019-09-01 MED ORDER — LIDOCAINE HCL (PF) 1 % IJ SOLN
INTRAMUSCULAR | Status: DC | PRN
  Administered 2019-09-01 (×2): 4 mL via EPIDURAL

## 2019-09-01 MED ORDER — LACTATED RINGERS IV SOLN
500.0000 mL | Freq: Once | INTRAVENOUS | Status: AC
  Administered 2019-09-01: 500 mL via INTRAVENOUS

## 2019-09-01 MED ORDER — PRENATAL MULTIVITAMIN CH
1.0000 | ORAL_TABLET | Freq: Every day | ORAL | Status: DC
  Administered 2019-09-02 – 2019-09-04 (×3): 1 via ORAL
  Filled 2019-09-01 (×3): qty 1

## 2019-09-01 MED ORDER — LACTATED RINGERS IV SOLN: INTRAVENOUS | Status: DC

## 2019-09-01 MED ORDER — SIMETHICONE 80 MG PO CHEW
80.0000 mg | CHEWABLE_TABLET | ORAL | Status: DC
  Administered 2019-09-02 – 2019-09-03 (×3): 80 mg via ORAL
  Filled 2019-09-01 (×3): qty 1

## 2019-09-01 MED ORDER — OXYCODONE-ACETAMINOPHEN 5-325 MG PO TABS: 1.0000 | ORAL_TABLET | ORAL | Status: DC | PRN

## 2019-09-01 MED ORDER — SOD CITRATE-CITRIC ACID 500-334 MG/5ML PO SOLN
30.0000 mL | ORAL | Status: DC | PRN
  Filled 2019-09-01: qty 30

## 2019-09-01 MED ORDER — OXYCODONE-ACETAMINOPHEN 5-325 MG PO TABS: 2.0000 | ORAL_TABLET | ORAL | Status: DC | PRN

## 2019-09-01 MED ORDER — DIBUCAINE (PERIANAL) 1 % EX OINT: 1.0000 "application " | TOPICAL_OINTMENT | CUTANEOUS | Status: DC | PRN

## 2019-09-01 MED ORDER — OXYTOCIN 40 UNITS IN NORMAL SALINE INFUSION - SIMPLE MED: 2.5000 [IU]/h | INTRAVENOUS | Status: AC

## 2019-09-01 MED ORDER — MORPHINE SULFATE (PF) 0.5 MG/ML IJ SOLN
INTRAMUSCULAR | Status: AC
  Filled 2019-09-01: qty 10

## 2019-09-01 MED ORDER — ONDANSETRON HCL 4 MG/2ML IJ SOLN: 4.0000 mg | Freq: Three times a day (TID) | INTRAMUSCULAR | Status: DC | PRN

## 2019-09-01 MED ORDER — CEFAZOLIN SODIUM-DEXTROSE 2-4 GM/100ML-% IV SOLN
2.0000 g | INTRAVENOUS | Status: AC
  Administered 2019-09-01: 2 g via INTRAVENOUS

## 2019-09-01 MED ORDER — KETOROLAC TROMETHAMINE 30 MG/ML IJ SOLN: 30.0000 mg | Freq: Four times a day (QID) | INTRAMUSCULAR | Status: AC | PRN

## 2019-09-01 MED ORDER — OXYTOCIN BOLUS FROM INFUSION: 500.0000 mL | Freq: Once | INTRAVENOUS | Status: DC

## 2019-09-01 MED ORDER — SODIUM CHLORIDE 0.9 % IV SOLN
500.0000 mg | INTRAVENOUS | Status: AC
  Administered 2019-09-01: 500 mg via INTRAVENOUS

## 2019-09-01 MED ORDER — COCONUT OIL OIL: 1.0000 "application " | TOPICAL_OIL | Status: DC | PRN

## 2019-09-01 MED ORDER — SODIUM CHLORIDE 0.9 % IV SOLN
INTRAVENOUS | Status: AC
  Filled 2019-09-01: qty 500

## 2019-09-01 MED ORDER — FENTANYL CITRATE (PF) 100 MCG/2ML IJ SOLN: 100.0000 ug | Freq: Once | INTRAMUSCULAR | Status: DC

## 2019-09-01 MED ORDER — OXYCODONE HCL 5 MG PO TABS: 5.0000 mg | ORAL_TABLET | ORAL | Status: DC | PRN

## 2019-09-01 MED ORDER — STERILE WATER FOR IRRIGATION IR SOLN
Status: DC | PRN
  Administered 2019-09-01: 1000 mL

## 2019-09-01 SURGICAL SUPPLY — 41 items
BENZOIN TINCTURE PRP APPL 2/3 (GAUZE/BANDAGES/DRESSINGS) ×3 IMPLANT
CELL SAVER LIPIGURD (MISCELLANEOUS) ×1 IMPLANT
CHLORAPREP W/TINT 26ML (MISCELLANEOUS) ×3 IMPLANT
CLAMP CORD UMBIL (MISCELLANEOUS) IMPLANT
CLOSURE WOUND 1/2 X4 (GAUZE/BANDAGES/DRESSINGS) ×1
CLOTH BEACON ORANGE TIMEOUT ST (SAFETY) ×3 IMPLANT
DRSG OPSITE POSTOP 4X10 (GAUZE/BANDAGES/DRESSINGS) ×3 IMPLANT
ELECT REM PT RETURN 9FT ADLT (ELECTROSURGICAL) ×3
ELECTRODE REM PT RTRN 9FT ADLT (ELECTROSURGICAL) ×1 IMPLANT
EXTRACTOR VACUUM BELL STYLE (SUCTIONS) IMPLANT
EXTRT SYSTEM ALEXIS 14CM (MISCELLANEOUS) ×3
GAUZE SPONGE 4X4 12PLY STRL LF (GAUZE/BANDAGES/DRESSINGS) ×6 IMPLANT
GLOVE BIOGEL PI IND STRL 6.5 (GLOVE) ×1 IMPLANT
GLOVE BIOGEL PI IND STRL 7.0 (GLOVE) ×2 IMPLANT
GLOVE BIOGEL PI INDICATOR 6.5 (GLOVE) ×2
GLOVE BIOGEL PI INDICATOR 7.0 (GLOVE) ×4
GLOVE ORTHOPEDIC STR SZ6.5 (GLOVE) ×3 IMPLANT
GOWN STRL REUS W/TWL LRG LVL3 (GOWN DISPOSABLE) ×9 IMPLANT
HOVERMATT SINGLE USE (MISCELLANEOUS) ×3 IMPLANT
KIT ABG SYR 3ML LUER SLIP (SYRINGE) IMPLANT
NEEDLE HYPO 22GX1.5 SAFETY (NEEDLE) ×3 IMPLANT
NEEDLE HYPO 25X1 1.5 SAFETY (NEEDLE) IMPLANT
NS IRRIG 1000ML POUR BTL (IV SOLUTION) ×3 IMPLANT
PACK C SECTION WH (CUSTOM PROCEDURE TRAY) ×3 IMPLANT
PAD ABD 7.5X8 STRL (GAUZE/BANDAGES/DRESSINGS) ×3 IMPLANT
PAD OB MATERNITY 4.3X12.25 (PERSONAL CARE ITEMS) ×3 IMPLANT
PENCIL SMOKE EVAC W/HOLSTER (ELECTROSURGICAL) ×3 IMPLANT
RETRACTOR TRAXI PANNICULUS (MISCELLANEOUS) ×1 IMPLANT
STRIP CLOSURE SKIN 1/2X4 (GAUZE/BANDAGES/DRESSINGS) ×2 IMPLANT
SUT MON AB 4-0 PS1 27 (SUTURE) ×3 IMPLANT
SUT PLAIN 2 0 (SUTURE) ×2
SUT PLAIN ABS 2-0 CT1 27XMFL (SUTURE) ×1 IMPLANT
SUT VIC AB 0 CT1 36 (SUTURE) ×6 IMPLANT
SUT VIC AB 0 CTX 36 (SUTURE) ×2
SUT VIC AB 0 CTX36XBRD ANBCTRL (SUTURE) ×1 IMPLANT
SUT VIC AB 4-0 KS 27 (SUTURE) ×3 IMPLANT
SYR CONTROL 10ML LL (SYRINGE) ×3 IMPLANT
TOWEL OR 17X24 6PK STRL BLUE (TOWEL DISPOSABLE) ×3 IMPLANT
TRAXI PANNICULUS RETRACTOR (MISCELLANEOUS) ×2
TRAY FOLEY W/BAG SLVR 14FR LF (SET/KITS/TRAYS/PACK) ×3 IMPLANT
WATER STERILE IRR 1000ML POUR (IV SOLUTION) ×3 IMPLANT

## 2019-09-01 NOTE — Progress Notes (Signed)
Labor Progress Note Ruth Perry is a 29 y.o. G2P1001 at [redacted]w[redacted]d presented for SOL  S: Better pain control follow epidural placement.  She still feels frequent contractions.  O:  BP 121/67   Pulse 74   Temp 98.6 F (37 C) (Oral)   Resp 20   Ht 5\' 2"  (1.575 m)   Wt 107.3 kg   SpO2 100%   BMI 43.26 kg/m  EFM: 155/moderate/pos accels. One late decel  CVE: Dilation: 8 Effacement (%): 100 Station: -2 Presentation: Vertex Exam by:: Mary [email or web address] Johnson, RN    A&P: 29 y.o. G2P1001 [redacted]w[redacted]d here for SOL.  #Labor: Progressing well. Managing expectantly.  AROM at 1630. Will continue to monitor. Significant risk for needing c-section. #Pain: much improved with epidural #FWB: category II. Overall reassuring. #GBS negative #Chlamydia: needs TOC outpatient.  [redacted]w[redacted]d, MD 4:32 PM

## 2019-09-01 NOTE — Discharge Summary (Deleted)
Postpartum Discharge Summary     Patient Name: Ruth Perry DOB: 1990-11-07 MRN: [id number]  Date of admission: 09/01/2019 Delivery date:09/01/2019  Delivering provider: Sloan Leiter  Date of discharge: 09/03/2019  Admitting diagnosis: Encounter for induction of labor [Z34.90] Intrauterine pregnancy: [redacted]w[redacted]d    Secondary diagnosis:  Active Problems:   Trichomonal vaginitis during pregnancy   History of cesarean delivery, currently pregnant   Obesity during pregnancy, antepartum   Chlamydia infection affecting pregnancy   Encounter for induction of labor  Additional problems: None    Discharge diagnosis: Term Pregnancy Delivered                                              Post partum procedures:None Augmentation: AROM Complications: None (placenta concerning for abruption intra-operatively and sent to pathology)  Hospital course: Onset of Labor With Unplanned C/S   29y.o. yo G2P2002 at 467w1das admitted in [facility] 09/01/2019. Patient arrived at 8/100/-2. She received her epidural and AROM was performed. Despite adequate ctx, patient did not progress pass 8-9 cm nor descend further in the pelvis; furthermore, recurrent late decelerations present. The patient went for cesarean section due to Arrest of Descent and Non-Reassuring FHR. Delivery details as follows: Membrane Rupture Time/Date: 4:28 PM ,09/01/2019   Delivery Method:C-Section, Low Transverse  Details of operation can be found in separate operative note. Patient had an uncomplicated postpartum course.  She is ambulating,tolerating a regular diet, passing flatus, and urinating well.  Patient is discharged home in stable condition 09/03/19.  Newborn Data: Birth date:09/01/2019  Birth time:7:46 PM  Gender:Female  Living status:Living  Apgars:8 ,9   Magnesium Sulfate received: No BMZ received: No Rhophylac:No MMR:No T-DaP:Given prenatally Flu: N/A Transfusion:No  Physical exam  Vitals:   09/02/19 1610  09/02/19 1900 09/02/19 2038 09/03/19 0548  BP: (!) 100/55 117/62 127/67 (!) 92/50  Pulse: 66 (!) 59 79 68  Resp: 20 20 19 20   Temp:  97.6 F (36.4 C) 97.9 F (36.6 C) 97.6 F (36.4 C)  TempSrc:   Oral Oral  SpO2: 100% 100% 100% 100%  Weight:      Height:       General: alert, cooperative and no distress Lochia: appropriate Uterine Fundus: firm Incision: Dressing is clean, dry, and intact DVT Evaluation: No evidence of DVT seen on physical exam. Labs: Lab Results  Component Value Date   WBC 19.4 (H) 09/02/2019   HGB 11.4 (L) 09/02/2019   HCT 34.7 (L) 09/02/2019   MCV 86.5 09/02/2019   PLT 211 09/02/2019   CMP Latest Ref Rng & Units 09/02/2019  Creatinine 0.44 - 1.00 mg/dL 0.63   Edinburgh Score: Edinburgh Postnatal Depression Scale Screening Tool 09/03/2019  I have been able to laugh and see the funny side of things. 0  I have looked forward with enjoyment to things. 0  I have blamed myself unnecessarily when things went wrong. 1  I have been anxious or worried for no good reason. 0  I have felt scared or panicky for no good reason. 2  Things have been getting on top of me. 2  I have been so unhappy that I have had difficulty sleeping. 0  I have felt sad or miserable. 0  I have been so unhappy that I have been crying. 0  The thought of harming myself has occurred  to me. 0  Edinburgh Postnatal Depression Scale Total 5     After visit meds:  Allergies as of 09/03/2019   No Known Allergies     Medication List    STOP taking these medications   aspirin EC 81 MG tablet   azithromycin 500 MG tablet Commonly known as: ZITHROMAX   metroNIDAZOLE 500 MG tablet Commonly known as: FLAGYL   terconazole 0.8 % vaginal cream Commonly known as: TERAZOL 3     TAKE these medications   albuterol 108 (90 Base) MCG/ACT inhaler Commonly known as: VENTOLIN HFA Inhale 2 puffs into the lungs every 6 (six) hours as needed for wheezing or shortness of breath.   ibuprofen 800 MG  tablet Commonly known as: ADVIL Take 1 tablet (800 mg total) by mouth every 6 (six) hours.   PRENATAL GUMMIES/DHA & FA PO Take by mouth.        Discharge home in stable condition Infant Feeding: Breast Infant Disposition:home with mother Discharge instruction: per After Visit Summary and Postpartum booklet. Activity: Advance as tolerated. Pelvic rest for 6 weeks.  Diet: routine diet Future Appointments: Future Appointments  Date Time Provider [place]  09/05/2019  9:45 AM [facility] None  09/09/2019 10:00 AM [facility] NURSE [facility] None  10/03/2019  9:45 AM Sloan Leiter, MD [place] None   Follow up Visit: Follow-up Information    [facility] Go in 4 week(s).   Why: for postpartum visit Contact information: [address] [place] [address] [phone]           Please schedule this patient for a Virtual postpartum visit in 4 weeks with the following provider: Any provider. Additional Postpartum F/U:Incision check 1 week  Low risk pregnancy complicated by: TOLAC Delivery mode:  C-Section, Low Transverse  Anticipated Birth Control:  Depo  Needs TOC for Chlamydia as outpatient.   09/03/2019 [place], DO

## 2019-09-01 NOTE — Transfer of Care (Signed)
Immediate Anesthesia Transfer of Care Note  Patient: Ruth Perry  Procedure(s) Performed: CESAREAN SECTION (N/A )  Patient Location: PACU  Anesthesia Type:Epidural  Level of Consciousness: awake, alert  and patient cooperative  Airway & Oxygen Therapy: Patient Spontanous Breathing  Post-op Assessment: Report given to RN and Post -op Vital signs reviewed and stable  Post vital signs: Reviewed and stable  Last Vitals:  Vitals Value Taken Time  BP 159/136 09/01/19 2042  Temp    Pulse 98 09/01/19 2045  Resp 28 09/01/19 2045  SpO2 100 % 09/01/19 2045  Vitals shown include unvalidated device data.  Last Pain:  Vitals:   09/01/19 1801  TempSrc: Axillary  PainSc:          Complications: No apparent anesthesia complications

## 2019-09-01 NOTE — H&P (Addendum)
OBSTETRIC ADMISSION HISTORY AND PHYSICAL  Ruth Perry is a 29 y.o. female G2P1001 with IUP at [redacted]w[redacted]d by  presenting for SOL. She reports +FMs, No LOF, no VB, no blurry vision, headaches or peripheral edema, and RUQ pain.  She plans on breast feeding. She request depo for birth control. She received her prenatal care at [facility]: By 58 wk [place] --->  Estimated Date of Delivery: 08/31/19  Sono:  @[redacted]w[redacted]d , CWD, normal anatomy, cephalic presentation, 1025E, 51% EFW   Prenatal History/Complications: Obesity: on ASA Chlamydia: positive on 11/12, 3/22 and 5/4. Needs TOC outpatient Trichomonas: positive on 11/12 negative on 3/22  Past Medical History: Past Medical History:  Diagnosis Date  . Asthma    as a child    Past Surgical History: Past Surgical History:  Procedure Laterality Date  . CESAREAN SECTION    . CHOLECYSTECTOMY      Obstetrical History: OB History    Gravida  2   Para  1   Term  1   Preterm      AB      Living  1     SAB      TAB      Ectopic      Multiple      Live Births  1           Social History: Social History   Socioeconomic History  . Marital status: Single    Spouse name: Not on file  . Number of children: Not on file  . Years of education: Not on file  . Highest education level: Not on file  Occupational History  . Not on file  Tobacco Use  . Smoking status: Never Smoker  . Smokeless tobacco: Never Used  Substance and Sexual Activity  . Alcohol use: Never  . Drug use: Never  . Sexual activity: Yes    Partners: Male    Birth control/protection: None  Other Topics Concern  . Not on file  Social History Narrative  . Not on file   Social Determinants of Health   Financial Resource Strain:   . Difficulty of Paying Living Expenses:   Food Insecurity:   . Worried About Charity fundraiser in the Last Year:   . Arboriculturist in the Last Year:   Transportation Needs:   . Film/video editor (Medical):   Marland Kitchen  Lack of Transportation (Non-Medical):   Physical Activity:   . Days of Exercise per Week:   . Minutes of Exercise per Session:   Stress:   . Feeling of Stress :   Social Connections:   . Frequency of Communication with Friends and Family:   . Frequency of Social Gatherings with Friends and Family:   . Attends Religious Services:   . Active Member of Clubs or Organizations:   . Attends Archivist Meetings:   Marland Kitchen Marital Status:     Family History: Family History  Problem Relation Age of Onset  . Heart attack Mother     Allergies: No Known Allergies  Medications Prior to Admission  Medication Sig Dispense Refill Last Dose  . albuterol (VENTOLIN HFA) 108 (90 Base) MCG/ACT inhaler Inhale 2 puffs into the lungs every 6 (six) hours as needed for wheezing or shortness of breath. 6.7 g 0   . aspirin EC 81 MG tablet Take 1 tablet (81 mg total) by mouth daily. Take after 12 weeks for prevention of preeclampsia later in pregnancy 300 tablet  0   . azithromycin (ZITHROMAX) 500 MG tablet Take 1gram at once with food. 2 tablet 2   . metroNIDAZOLE (FLAGYL) 500 MG tablet Take 1 tablet (500 mg total) by mouth 2 (two) times daily. (Patient not taking: Reported on 08/09/2019) 14 tablet 2   . Prenatal MV-Min-FA-Omega-3 (PRENATAL GUMMIES/DHA & FA PO) Take by mouth.     . terconazole (TERAZOL 3) 0.8 % vaginal cream Place 1 applicator vaginally at bedtime. Apply nightly for three nights. (Patient not taking: Reported on 08/09/2019) 20 g 2      Review of Systems   All systems reviewed and negative except as stated in HPI  Blood pressure 128/76, pulse 82, temperature 98.8 F (37.1 C), temperature source Oral, resp. rate 20, height 5\' 2"  (1.575 m), weight 107.3 kg, SpO2 100 %. General appearance: alert, cooperative and appears stated age Pulm: normal respiratory effort Cardiac: regular rate. Fetal monitoringBaseline: 150 bpm, Variability: Good {> 6 bpm) and Accelerations: Reactive Uterine  activityFrequency: Every 3 minutes     Prenatal labs: ABO, Rh: O/Positive/-- (01/06 0911) Antibody: Comment, See Final Results (01/06 0911) Rubella: 1.19 (01/06 0911) RPR: Non Reactive (03/22 1049)  HBsAg: Negative (01/06 0911)  HIV: Non Reactive (03/22 1049)  GBS:   neg Normal 2 hour. Genetic screening  normal Anatomy 05-22-1974 normal  Prenatal Transfer Tool  Maternal Diabetes: No Genetic Screening: Normal Maternal Ultrasounds/Referrals: Normal Fetal Ultrasounds or other Referrals:  None Maternal Substance Abuse:  No Significant Maternal Medications:  None Significant Maternal Lab Results: Other: Chlamydia positive. Needs TOC.  No results found for this or any previous visit (from the past 24 hour(s)).  Patient Active Problem List   Diagnosis Date Noted  . Encounter for induction of labor 09/01/2019  . Chlamydia infection affecting pregnancy 05/09/2019  . History of cesarean delivery, currently pregnant 04/24/2019  . Obesity during pregnancy, antepartum 04/24/2019  . Supervision of other normal pregnancy, antepartum 03/26/2019  . Bleeding in early pregnancy 03/01/2019  . Trichomonal vaginitis during pregnancy 03/01/2019    Assessment/Plan:  Ruth Perry is a 29 y.o. G2P1001 at [redacted]w[redacted]d here for SOL.  #Labor: Expectant management. #Pain: Planning for epidural.   #FWB: Category I #ID:  GBS negative. TOC for chlamydia as outpatient #MOF: breast #MOC:Depo #Circ:  no #Chlamydia: needs outpatient TOC  [redacted]w[redacted]d, MD  09/01/2019, 1:15 PM  I saw and evaluated the patient. I agree with the findings and the plan of care as documented in the resident's note. Vertex by RN exam. TOLAC. Hx of c-section in 09/03/2019. Patient reports that she was completely dilated and pushed for about 1 hour but the baby never descended into her pelvis. She was told prior to delivery that she had a small pelvis. Reports this infant feels larger than her first. First baby [phone]. EFW for this infant 3500g. VBAC  consent signed 07/22/19. Patient was never told she could not delivery vaginally after c-section; op note note seen in chart. Patient initially did not want an epidural. Discussed options and possible AROM. Patient ultimately decided to have epidural. Will get patient comfortable and then AROM if appropriate. Anticipate VBAC, CS as appropriate.   09/21/19, MD [facility] Family Medicine Fellow, [facility] for [facility]

## 2019-09-01 NOTE — MAU Note (Signed)
Ruth Perry is a 29 y.o. at [redacted]w[redacted]d here in MAU reporting: contractions since 0900, they are every 5 minutes. Feels like she needs to poop during the contraction. No bleeding or LOF. +FM  Onset of complaint: today  Pain score: 8/10  Vitals:   09/01/19 1258  BP: 128/76  Pulse: 82  Resp: 20  Temp: 98.8 F (37.1 C)  SpO2: 100%     FHT: +FM  Lab orders placed from triage: none

## 2019-09-01 NOTE — Anesthesia Procedure Notes (Signed)
Epidural Patient location during procedure: OB Start time: 09/01/2019 2:39 PM End time: 09/01/2019 2:47 PM  Staffing Anesthesiologist: Mal Amabile, MD Performed: anesthesiologist   Preanesthetic Checklist Completed: patient identified, IV checked, site marked, risks and benefits discussed, surgical consent, monitors and equipment checked, pre-op evaluation and timeout performed  Epidural Patient position: sitting Prep: DuraPrep and site prepped and draped Patient monitoring: continuous pulse ox and blood pressure Approach: midline Location: L3-L4 Injection technique: LOR air  Needle:  Needle type: Tuohy  Needle gauge: 17 G Needle length: 9 cm and 9 Needle insertion depth: 7 cm Catheter type: closed end flexible Catheter size: 19 Gauge Catheter at skin depth: 11 cm Test dose: negative and Other  Assessment Events: blood not aspirated, injection not painful, no injection resistance, no paresthesia and negative IV test  Additional Notes Patient identified. Risks and benefits discussed including failed block, incomplete  Pain control, post dural puncture headache, nerve damage, paralysis, blood pressure Changes, nausea, vomiting, reactions to medications-both toxic and allergic and post Partum back pain. All questions were answered. Patient expressed understanding and wished to proceed. Sterile technique was used throughout procedure. Epidural site was Dressed with sterile barrier dressing. No paresthesias, signs of intravascular injection Or signs of intrathecal spread were encountered.  Patient was more comfortable after the epidural was dosed. Please see RN's note for documentation of vital signs and FHR which are stable. Reason for block:procedure for pain

## 2019-09-01 NOTE — Progress Notes (Signed)
Went bedside for late decels. Cervix still 9/100/-2. Pelvic outlet does feel narrow. Prolonged decel when on back. IUPC placed right posterior to monitor adequacy of ctx.  Positioned to right lateral with improvement back to baseline. Will cont to monitor closely. Discussed with patient that if not making progress and decels persist, will recommend repeat c-section. Will update Dr. Earlene Plater.   Jerilynn Birkenhead, MD [facility] Family Medicine Fellow, [facility] for [employer], [facility]

## 2019-09-01 NOTE — Progress Notes (Signed)
Delivery Note:  C-section       09/01/2019  8:02 PM  I was called to the operating room at the request of the patient's obstetrician (Dr. Earlene Plater) for a repeat c-section.  PRENATAL HX:  This is a 29 y/o G2P1001 at 58 and 1/[redacted] weeks gestation who was admitted in labor.  Pregnancy complicated by Obesity (on ASA), Chlamydia (positive on 11/12, 3/22 and 5/4, Needs TOC outpatient), and Trichomonas (positive on 11/12 negative on 3/22).  She is GBS negative with AROM x 3 hours.  C-section for fetal intolerance to labor.    DELIVERY:  Infant was vigorous at delivery, requiring no resuscitation other than standard warming, drying and stimulation.  APGARs 8 and 9.  Exam within normal limits.  After 5 minutes, baby left with nurse to assist parents with skin-to-skin care.   _____________________ Electronically Signed By: Maryan Char, MD Neonatologist

## 2019-09-01 NOTE — Op Note (Signed)
[facility] PROCEDURE DATE: 09/01/2019  PREOPERATIVE DIAGNOSES: Intrauterine pregnancy at [redacted]w[redacted]d weeks gestation; failure to progress: arrest of descent, non-reassuring fetal status  POSTOPERATIVE DIAGNOSES: The same  PROCEDURE: Repeat Low Transverse Cesarean Section  SURGEON:  Dr. Conan Bowens - Primary Dr. Jerilynn Birkenhead - Fellow  ASSISTANT:  Dr. Mirian Mo   ANESTHESIOLOGY TEAM: Anesthesiologist: Mal Amabile, MD CRNA: Trellis Paganini, CRNA  INDICATIONS: Ruth Perry is a 29 y.o. [phone] at 101w1d here for cesarean section secondary to the indications listed under preoperative diagnoses; please see preoperative note for further details.  The risks of surgery were discussed with the patient including but were not limited to: bleeding which may require transfusion or reoperation; infection which may require antibiotics; injury to bowel, bladder, ureters or other surrounding organs; injury to the fetus; need for additional procedures including hysterectomy in the event of a life-threatening hemorrhage; formation of adhesions; placental abnormalities wth subsequent pregnancies; incisional problems; thromboembolic phenomenon and other postoperative/anesthesia complications.  The patient concurred with the proposed plan, giving informed written consent for the procedure.    FINDINGS:  Viable female infant in cephalic presentation. Meconium-stained amniotic fluid.  Intact placenta, three vessel cord.  Normal uterus, fallopian tubes and ovaries bilaterally. Overall paucity of scar tissue and adhesions. Placenta was exiting hysterotomy prior to cord clamping and appeared to have large amount of dark blood concerning for abruption. APGAR (1 MIN): 8   APGAR (5 MINS): 9   APGAR (10 MINS):    ANESTHESIA: Epidural  INTRAVENOUS FLUIDS: 1500 ml   ESTIMATED BLOOD LOSS: 346 ml URINE OUTPUT:  350 ml SPECIMENS: Placenta sent to pathology COMPLICATIONS: None immediate  PROCEDURE IN DETAIL:  The  patient preoperatively received intravenous antibiotics and had sequential compression devices applied to her lower extremities.  She was then taken to the operating room where the epidural anesthesia was dosed up to surgical level and was found to be adequate. She was then placed in a dorsal supine position with a leftward tilt, and prepped and draped in a sterile manner.  A foley catheter had been previously placed into her bladder and attached to constant gravity.  After an adequate timeout was performed, a Pfannenstiel skin incision was made with scalpel on her preexisting scar and carried through to the underlying layer of fascia. The fascia was incised in the midline, and this incision was extended bilaterally using the Mayo scissors.  Kocher clamps were applied to the inferior aspect of the fascial incision and the underlying rectus muscles were dissected off bluntly and sharply.  A similar process was carried out on the superior aspect of the fascial incision. The rectus muscles were separated in the midline and the peritoneum was entered bluntly. Peritoneum extended laterally with Bovie on left side. The Alexis self-retaining retractor was introduced into the abdominal cavity.  Attention was turned to the lower uterine segment where a bladder flap was created and then a low transverse hysterotomy was made with a scalpel and extended bilaterally bluntly.  The infant was successfully delivered, the cord was clamped and cut after one minute, and the infant was handed over to the awaiting neonatology team. Uterine massage was then administered, and the placenta delivered intact with a three-vessel cord. The uterus was then cleared of clots and debris.  The hysterotomy was closed with 0 Vicryl in a running locked fashion, and an imbricating layer was also placed with 0 Vicryl. The pelvis was cleared of all clot and debris. Hemostasis was confirmed on all surfaces.  The retractor was removed. The fascia was then  closed using 0 Vicryl in a running fashion.  The subcutaneous layer was irrigated, reapproximated with 2-0 plain gut running stitches and the skin was closed with a 4-0 Vicryl subcuticular stitch. The patient tolerated the procedure well. Sponge, instrument and needle counts were correct x 3.  She was taken to the recovery room in stable condition.   Barrington Ellison, MD [facility] Family Medicine Fellow, [facility] for [employer], [place]

## 2019-09-01 NOTE — Anesthesia Preprocedure Evaluation (Addendum)
Anesthesia Evaluation  Patient identified by MRN, date of birth, ID band Patient awake    Reviewed: Allergy & Precautions, Patient's Chart, lab work & pertinent test results  Airway Mallampati: II  TM Distance: >3 FB Neck ROM: Full    Dental no notable dental hx. (+) Teeth Intact   Pulmonary asthma ,    Pulmonary exam normal breath sounds clear to auscultation       Cardiovascular negative cardio ROS Normal cardiovascular exam Rhythm:Regular Rate:Normal     Neuro/Psych negative neurological ROS  negative psych ROS   GI/Hepatic Neg liver ROS, GERD  ,  Endo/Other  Morbid obesity  Renal/GU negative Renal ROS  negative genitourinary   Musculoskeletal negative musculoskeletal ROS (+)   Abdominal (+) + obese,   Peds  Hematology negative hematology ROS (+)   Anesthesia Other Findings   Reproductive/Obstetrics (+) Pregnancy Previous C/section Attempting VBAC                             Anesthesia Physical Anesthesia Plan  ASA: III and emergent  Anesthesia Plan: Epidural   Post-op Pain Management:    Induction:   PONV Risk Score and Plan: 4 or greater and Ondansetron, Scopolamine patch - Pre-op and Treatment may vary due to age or medical condition  Airway Management Planned: Natural Airway  Additional Equipment:   Intra-op Plan:   Post-operative Plan:   Informed Consent: I have reviewed the patients History and Physical, chart, labs and discussed the procedure including the risks, benefits and alternatives for the proposed anesthesia with the patient or authorized representative who has indicated his/her understanding and acceptance.       Plan Discussed with: Anesthesiologist, CRNA and Surgeon  Anesthesia Plan Comments: (Patient for C/Section for arrest of descent. Will use epidural for C/Section. M. Foster,MD)       Anesthesia Quick Evaluation

## 2019-09-01 NOTE — Anesthesia Postprocedure Evaluation (Signed)
Anesthesia Post Note  Patient: Ruth Perry  Procedure(s) Performed: CESAREAN SECTION (N/A )     Patient location during evaluation: PACU Anesthesia Type: Epidural Level of consciousness: oriented and awake and alert Pain management: pain level controlled Vital Signs Assessment: post-procedure vital signs reviewed and stable Respiratory status: spontaneous breathing, respiratory function stable and nonlabored ventilation Cardiovascular status: blood pressure returned to baseline and stable Postop Assessment: no headache, no backache, no apparent nausea or vomiting, epidural receding and patient able to bend at knees Anesthetic complications: no    Last Vitals:  Vitals:   09/01/19 2130 09/01/19 2145  BP: (!) 105/58 124/75  Pulse: 85 76  Resp: (!) 22 19  Temp:  36.9 C  SpO2: 100% 99%    Last Pain:  Vitals:   09/01/19 2145  TempSrc:   PainSc: 0-No pain   Pain Goal:                Epidural/Spinal Function Cutaneous sensation: Able to Wiggle Toes (09/01/19 2145), Patient able to flex knees: Yes (09/01/19 2145), Patient able to lift hips off bed: Yes (09/01/19 2145), Back pain beyond tenderness at insertion site: No (09/01/19 2145), Progressively worsening motor and/or sensory loss: No (09/01/19 2145), Bowel and/or bladder incontinence post epidural: No (09/01/19 2145)  [name].

## 2019-09-01 NOTE — Progress Notes (Addendum)
At bedside with Dr. Earlene Plater. Moderate variability but now having recurrent late decelerations. Has been 8-9 cm with minimal to no descent for past 5-6 hours. Adequate ctx currently on IUPC. Reviewed risks/benefits of proceeding with repeat c-section, recommended we proceed, patient verbalizes understanding of the above and is agreeable to plan. Will proceed with repeat c-section. The risks of surgery were discussed with the patient including but were not limited to: bleeding which may require transfusion or reoperation; infection which may require antibiotics; injury to bowel, bladder, ureters or other surrounding organs; injury to the fetus; need for additional procedures including hysterectomy in the event of a life-threatening hemorrhage; formation of adhesions; placental abnormalities wth subsequent pregnancies; incisional problems; thromboembolic phenomenon and other postoperative/anesthesia complications.  The patient concurred with the proposed plan, giving informed written consent for the procedure.   Patient has been NPO since midnight she will remain NPO for procedure. Anesthesia and OR aware. Preoperative prophylactic antibiotics and SCDs ordered on call to the OR.  To OR when ready.  Jerilynn Birkenhead, MD OB Family Medicine Fellow, [facility] for [employer], [facility]    Attestation of Attending Supervision of [place] Fellow: Evaluation, management, and procedures were performed by the [facility] Fellow under my supervision and collaboration.  I have reviewed the OB Fellow's note and chart, and I agree with the management and plan.  Baldemar Lenis, M.D. Attending Center for [employer] (Faculty Practice)  09/01/2019 7:18 PM

## 2019-09-02 LAB — CBC
HCT: 34.7 % — ABNORMAL LOW (ref 36.0–46.0)
Hemoglobin: 11.4 g/dL — ABNORMAL LOW (ref 12.0–15.0)
MCH: 28.4 pg (ref 26.0–34.0)
MCHC: 32.9 g/dL (ref 30.0–36.0)
MCV: 86.5 fL (ref 80.0–100.0)
Platelets: 211 10*3/uL (ref 150–400)
RBC: 4.01 MIL/uL (ref 3.87–5.11)
RDW: 14.2 % (ref 11.5–15.5)
WBC: 19.4 10*3/uL — ABNORMAL HIGH (ref 4.0–10.5)
nRBC: 0 % (ref 0.0–0.2)

## 2019-09-02 LAB — CREATININE, SERUM
Creatinine, Ser: 0.63 mg/dL (ref 0.44–1.00)
GFR calc Af Amer: >60 mL/min (ref >60)
GFR calc non Af Amer: >60 mL/min (ref >60)

## 2019-09-02 LAB — RPR: RPR Ser Ql: NONREACTIVE

## 2019-09-02 LAB — TYPE AND SCREEN
ABO/RH(D): O POS
Antibody Screen: NEGATIVE

## 2019-09-02 NOTE — Progress Notes (Signed)
Patient screened out for psychosocial assessment since none of the following apply: °Psychosocial stressors documented in mother or baby's chart °Gestation less than 32 weeks °Code at delivery  °Infant with anomalies °Please contact the Clinical Social Worker if specific needs arise, by MOB's request, or if MOB scores greater than 9/yes to question 10 on Edinburgh Postpartum Depression Screen. ° °Kazaria Gaertner Boyd-Gilyard, MSW, LCSW °Clinical Social Work °[phone] °  °

## 2019-09-02 NOTE — Progress Notes (Signed)
Educated and set up DEBP.

## 2019-09-02 NOTE — Progress Notes (Addendum)
POSTPARTUM PROGRESS NOTE  Post-Op Day #1: S/P rLTCS for arrest of descent/NRFHT after presenting for SOL, TOLAC  Subjective:  Ruth Perry is a 29 y.o. G2P2002 [redacted]w[redacted]d POD #1 s/p  rLTCS for arrest of descent/NRFHT after presenting for SOL, TOLAC.  No acute events overnight.  Pt denies problems with ambulating, voiding or po intake.  She denies nausea or vomiting.  Pain is well controlled.  She has not had flatus. She has not had bowel movement.  Lochia Moderate.   Objective: Blood pressure 114/62, pulse 62, temperature 98.4 F (36.9 C), temperature source Oral, resp. rate 18, height 5\' 2"  (1.575 m), weight 107.3 kg, SpO2 98 %, unknown if currently breastfeeding.  Physical Exam:  General: alert, cooperative and no distress Lochia:normal flow Chest: CTAB Heart: RRR no m/r/g Abdomen: +BS, soft, nontender, bandage over incision clean and dry Uterine Fundus: firm DVT Evaluation: No calf swelling or tenderness Extremities: No edema  Recent Labs    09/01/19 1315 09/02/19 0432  HGB 12.9 11.4*  HCT 39.3 34.7*     Assessment/Plan:  ASSESSMENT: Ruth Perry is a 29 y.o. G2P2002 [redacted]w[redacted]d  POD #1 s/p  RLTCS.     - Plan for discharge tomorrow - Breast and bottle feeding - Lactation consultation, has not yet breast fed - Chlamydia: needs TOC outpatient - No circumcision - Contraception: Depo prior to discharge - Ambulate   LOS: 1 day   [redacted]w[redacted]d, DO 09/02/2019, 6:15 AM   Attestation of Supervision of Student:  I confirm that I have verified the information documented in the  resident's  note and that I have also personally reperformed the history, physical exam and all medical decision making activities.  I have verified that all services and findings are accurately documented in this student's note; and I agree with management and plan as outlined in the documentation. I have also made any necessary editorial changes.  --S/p bedside rounding with CNM at 0945. Patient  encouraged to introduce brief periods of ambulation in room after lunchtime.  09/04/2019, CNM Center for [facility], [facility] 09/02/2019 10:37 AM

## 2019-09-02 NOTE — Lactation Note (Signed)
This note was copied from a baby's chart. Lactation Consultation Note  Patient Name: Ruth Perry [id number] Date: 09/02/2019 Reason for consult: Initial assessment;NICU baby;Term  LC in to visit with P2 Mom of term baby in the NICU for low CBGs.  Met with Mom in baby's room in the NICU.  Baby 69 hrs old.  Mom was set up with a DEBP this am, and has pumped once.  Milk collected, breast milk labels obtained from NICU RN.  Mom knows to bring EBM to NICU for baby.  Washed pump parts and set up separate drying bin for pump parts.  Baby has an IV as blood sugars are still borderline and baby is able to feed ad lib.  Mom states she tried to latch baby after he was born, but he wouldn't.  Baby has had 9 bottle feedings since birth.   Offered to assist with positioning and latch.  Mom agreeable.   Mom became dizzy and started having a lot of vaginal bleeding.  MBU RN came down to transport Mom back to her room.   NICU booklet and Lactation brochure left in Mom's room.  Will follow-up when Mom is feeling better.    Interventions Interventions: Breast feeding basics reviewed;Skin to skin;Breast massage;Hand express;DEBP  Lactation Tools Discussed/Used Tools: Pump Breast pump type: Double-Electric Breast Pump WIC Program: Yes Pump Review: Setup, frequency, and cleaning;Milk Storage Initiated by:: MBU RN Date initiated:: 09/02/19   Consult Status Consult Status: Follow-up Date: 09/03/19 Follow-up type: In-patient    Broadus John 09/02/2019, 4:18 PM

## 2019-09-02 NOTE — Progress Notes (Signed)
Called from NICU because mother was bleeding. Pt stood and blood trickled down her leg. Patient was assisted back to room via wheelchair. Back to bed. Fundus firm at umbilicus, no clots expressed and bleeding WNL. The pad was shifted to one side and small amount of blood noted on the pad. Mother assisted to bathroom, tolerated OOB well. Unable to void, peri care and hygiene care done. Pt back to bed. BP 94/39 and 100/55. IV fluids of LR restarted and patient encouraged to drink. Dr. Sharol Harness was notified on the above, no orders at this time.

## 2019-09-02 NOTE — Progress Notes (Signed)
Patient currently using DEBP.

## 2019-09-03 ENCOUNTER — Encounter: Payer: Medicaid Other | Admitting: Nurse Practitioner

## 2019-09-03 LAB — SURGICAL PATHOLOGY

## 2019-09-03 MED ORDER — IBUPROFEN 800 MG PO TABS: 800.0000 mg | ORAL_TABLET | Freq: Four times a day (QID) | ORAL | 0 refills | Status: DC

## 2019-09-03 NOTE — Progress Notes (Addendum)
POSTPARTUM PROGRESS NOTE  Post-Op Day #2: S/P rLTCS for arrest of descent/NRFHT after presenting for SOL, TOLAC  Subjective:  Ruth Perry is a 29 y.o. G2P2002 28w1dPOD #2 s/p  rLTCS for arrest of descent/NRFHT after presenting for SOL, TOLAC.  No acute events overnight.  Pt denies problems with ambulating, voiding or po intake.  She denies nausea or vomiting.  Pain is well controlled.  She has had flatus. She has not had bowel movement.  Lochia Moderate.   Objective: Blood pressure (!) 92/50, pulse 68, temperature 97.6 F (36.4 C), temperature source Oral, resp. rate 20, height 5' 2"  (1.575 m), weight 107.3 kg, SpO2 100 %, unknown if currently breastfeeding.  Physical Exam:  General: alert, cooperative and no distress Lochia:normal flow Chest: CTAB Heart: RRR no m/r/g Abdomen: +BS, soft, nontender, bandage over incision clean and dry Uterine Fundus: firm DVT Evaluation: No calf swelling or tenderness Extremities: No edema  Recent Labs    09/01/19 1315 09/02/19 0432  HGB 12.9 11.4*  HCT 39.3 34.7*     Assessment/Plan:  ASSESSMENT: SBreleigh Carpinois a 29y.o. G2P2002 417w1dPOD #2 s/p  RLTCS.     - Plan for discharge tomorrow, baby is in NICU - Breast and bottle feeding - Lactation consultation, has not yet breast fed - Chlamydia: needs TOC outpatient - No circumcision - Contraception: Depo prior to discharge - Ambulate   LOS: 2 days   BaCleophas DunkerDO  I personally saw and evaluated the patient, performing the key elements of the service. I developed and verified the management plan that is described in the resident's/student's note, and I agree with the content with my edits above. VSS, HRR&R, Resp unlabored, Legs neg.  FrNigel BertholdCNM 09/03/2019 8:34 AM    09/03/2019, 8:18 AM

## 2019-09-03 NOTE — Lactation Note (Signed)
This note was copied from a baby's chart. Lactation Consultation Note  Patient Name: Ruth Perry HTDSK'A Date: 09/03/2019 Reason for consult: NICU baby;Follow-up assessment;Term Baby is 38 hours in the NICU for low blood sugars.  Mom states she has not pumped since yesterday.  Stressed importance of pumping every 3 hours to establish a good milk supply.  Mom does not have a breast pump at home.  She has WIC in [place].  Pump referral faxed to [facility] office.  Encouraged to call for assist prn.  Maternal Data    Feeding Feeding Type: Formula Nipple Type: Nfant Slow Flow (purple)  LATCH Score                   Interventions    Lactation Tools Discussed/Used     Consult Status Consult Status: Follow-up Date: 09/04/19 Follow-up type: In-patient    Huston Foley 09/03/2019, 10:06 AM

## 2019-09-03 NOTE — Discharge Instructions (Signed)
Cesarean Delivery, Care After This sheet gives you information about how to care for yourself after your procedure. Your health care provider may also give you more specific instructions. If you have problems or questions, contact your health care provider. What can I expect after the procedure? After the procedure, it is common to have:  A small amount of blood or clear fluid coming from the incision.  Some redness, swelling, and pain in your incision area.  Some abdominal pain and soreness.  Vaginal bleeding (lochia). Even though you did not have a vaginal delivery, you will still have vaginal bleeding and discharge.  Pelvic cramps.  Fatigue. You may have pain, swelling, and discomfort in the tissue between your vagina and your anus (perineum) if:  Your C-section was unplanned, and you were allowed to labor and push.  An incision was made in the area (episiotomy) or the tissue tore during attempted vaginal delivery. Follow these instructions at home: Incision care   Follow instructions from your health care provider about how to take care of your incision. Make sure you: ? Wash your hands with soap and water before you change your bandage (dressing). If soap and water are not available, use hand sanitizer. ? If you have a dressing, change it or remove it as told by your health care provider. ? Leave stitches (sutures), skin staples, skin glue, or adhesive strips in place. These skin closures may need to stay in place for 2 weeks or longer. If adhesive strip edges start to loosen and curl up, you may trim the loose edges. Do not remove adhesive strips completely unless your health care provider tells you to do that.  Check your incision area every day for signs of infection. Check for: ? More redness, swelling, or pain. ? More fluid or blood. ? Warmth. ? Pus or a bad smell.  Do not take baths, swim, or use a hot tub until your health care provider says it's okay. Ask your health  care provider if you can take showers.  When you cough or sneeze, hug a pillow. This helps with pain and decreases the chance of your incision opening up (dehiscing). Do this until your incision heals. Medicines  Take over-the-counter and prescription medicines only as told by your health care provider.  If you were prescribed an antibiotic medicine, take it as told by your health care provider. Do not stop taking the antibiotic even if you start to feel better.  Do not drive or use heavy machinery while taking prescription pain medicine. Lifestyle  Do not drink alcohol. This is especially important if you are breastfeeding or taking pain medicine.  Do not use any products that contain nicotine or tobacco, such as cigarettes, e-cigarettes, and chewing tobacco. If you need help quitting, ask your health care provider. Eating and drinking  Drink at least 8 eight-ounce glasses of water every day unless told not to by your health care provider. If you breastfeed, you may need to drink even more water.  Eat high-fiber foods every day. These foods may help prevent or relieve constipation. High-fiber foods include: ? Whole grain cereals and breads. ? Brown rice. ? Beans. ? Fresh fruits and vegetables. Activity   If possible, have someone help you care for your baby and help with household activities for at least a few days after you leave the hospital.  Return to your normal activities as told by your health care provider. Ask your health care provider what activities are safe for   you.  Rest as much as possible. Try to rest or take a nap while your baby is sleeping.  Do not lift anything that is heavier than 10 lbs (4.5 kg), or the limit that you were told, until your health care provider says that it is safe.  Talk with your health care provider about when you can engage in sexual activity. This may depend on your: ? Risk of infection. ? How fast you heal. ? Comfort and desire to  engage in sexual activity. General instructions  Do not use tampons or douches until your health care provider approves.  Wear loose, comfortable clothing and a supportive and well-fitting bra.  Keep your perineum clean and dry. Wipe from front to back when you use the toilet.  If you pass a blood clot, save it and call your health care provider to discuss. Do not flush blood clots down the toilet before you get instructions from your health care provider.  Keep all follow-up visits for you and your baby as told by your health care provider. This is important. Contact a health care provider if:  You have: ? A fever. ? Bad-smelling vaginal discharge. ? Pus or a bad smell coming from your incision. ? Difficulty or pain when urinating. ? A sudden increase or decrease in the frequency of your bowel movements. ? More redness, swelling, or pain around your incision. ? More fluid or blood coming from your incision. ? A rash. ? Nausea. ? Little or no interest in activities you used to enjoy. ? Questions about caring for yourself or your baby.  Your incision feels warm to the touch.  Your breasts turn red or become painful or hard.  You feel unusually sad or worried.  You vomit.  You pass a blood clot from your vagina.  You urinate more than usual.  You are dizzy or light-headed. Get help right away if:  You have: ? Pain that does not go away or get better with medicine. ? Chest pain. ? Difficulty breathing. ? Blurred vision or spots in your vision. ? Thoughts about hurting yourself or your baby. ? New pain in your abdomen or in one of your legs. ? A severe headache.  You faint.  You bleed from your vagina so much that you fill more than one sanitary pad in one hour. Bleeding should not be heavier than your heaviest period. Summary  After the procedure, it is common to have pain at your incision site, abdominal cramping, and slight bleeding from your vagina.  Check  your incision area every day for signs of infection.  Tell your health care provider about any unusual symptoms.  Keep all follow-up visits for you and your baby as told by your health care provider. This information is not intended to replace advice given to you by your health care provider. Make sure you discuss any questions you have with your health care provider. Document Revised: 10/11/2017 Document Reviewed: 10/11/2017 Elsevier Patient Education  2020 Elsevier Inc.  

## 2019-09-03 NOTE — Discharge Summary (Addendum)
Postpartum Discharge Summary     Patient Name: Ruth Perry DOB: July 07, 1990 MRN: [id number]  Date of admission: 09/01/2019 Delivery date:09/01/2019  Delivering provider: Sloan Leiter  Date of discharge: 09/04/2019  Admitting diagnosis: Encounter for induction of labor [Z34.90] Intrauterine pregnancy: [redacted]w[redacted]d    Secondary diagnosis:  Active Problems:   Trichomonal vaginitis during pregnancy   History of cesarean delivery, currently pregnant   Obesity during pregnancy, antepartum   Chlamydia infection affecting pregnancy   Encounter for induction of labor  Additional problems: None    Discharge diagnosis: Term Pregnancy Delivered                                              Post partum procedures:None Augmentation: AROM Complications: None (placenta concerning for abruption intra-operatively and sent to pathology)  Hospital course: Onset of Labor With Unplanned C/S   29y.o. yo G2P2002 at 410w1das [place]. Patient arrived at 8/100/-2. She received her epidural and AROM was performed. Despite adequate ctx, patient did not progress pass 8-9 cm nor descend further in the pelvis; furthermore, recurrent late decelerations present. The patient went for cesarean section due to Arrest of Descent and Non-Reassuring FHR. Delivery details as follows: Membrane Rupture Time/Date: 4:28 PM ,09/01/2019   Delivery Method:C-Section, Low Transverse  Details of operation can be found in separate operative note. Patient had an uncomplicated postpartum course.  She is ambulating,tolerating a regular diet, passing flatus, and urinating well.  Patient is discharged home in stable condition 09/04/19.  Newborn Data: Birth date:09/01/2019  Birth time:7:46 PM  Gender:Female  Living status:Living  Apgars:8 ,9   Magnesium Sulfate received: No BMZ received: No Rhophylac:No MMR:No T-DaP:Given prenatally Flu: N/A Transfusion:No  Physical exam  Vitals:   09/03/19 0548 09/03/19  1259 09/03/19 1808 09/03/19 2143  BP: (!) 92/50 (!) 99/48 (!) 104/56 107/60  Pulse: 68 83 91 80  Resp: 20 18 18 18   Temp: 97.6 F (36.4 C) 97.8 F (36.6 C) 97.8 F (36.6 C) 98.6 F (37 C)  TempSrc: Oral Oral Oral Oral  SpO2: 100%  100%   Weight:      Height:       General: alert, cooperative and no distress Lochia: appropriate Uterine Fundus: firm Incision: Dressing is clean, dry, and intact DVT Evaluation: No evidence of DVT seen on physical exam. Labs: Lab Results  Component Value Date   WBC 19.4 (H) 09/02/2019   HGB 11.4 (L) 09/02/2019   HCT 34.7 (L) 09/02/2019   MCV 86.5 09/02/2019   PLT 211 09/02/2019   CMP Latest Ref Rng & Units 09/02/2019  Creatinine 0.44 - 1.00 mg/dL 0.63   Edinburgh Score: Edinburgh Postnatal Depression Scale Screening Tool 09/03/2019  I have been able to laugh and see the funny side of things. 0  I have looked forward with enjoyment to things. 0  I have blamed myself unnecessarily when things went wrong. 1  I have been anxious or worried for no good reason. 0  I have felt scared or panicky for no good reason. 2  Things have been getting on top of me. 2  I have been so unhappy that I have had difficulty sleeping. 0  I have felt sad or miserable. 0  I have been so unhappy that I have been crying. 0  The thought of harming myself has occurred  to me. 0  Edinburgh Postnatal Depression Scale Total 5     After visit meds:  Allergies as of 09/04/2019   No Known Allergies     Medication List    STOP taking these medications   aspirin EC 81 MG tablet   azithromycin 500 MG tablet Commonly known as: ZITHROMAX   metroNIDAZOLE 500 MG tablet Commonly known as: FLAGYL   terconazole 0.8 % vaginal cream Commonly known as: TERAZOL 3     TAKE these medications   albuterol 108 (90 Base) MCG/ACT inhaler Commonly known as: VENTOLIN HFA Inhale 2 puffs into the lungs every 6 (six) hours as needed for wheezing or shortness of breath.   ibuprofen  800 MG tablet Commonly known as: ADVIL Take 1 tablet (800 mg total) by mouth every 6 (six) hours.   PRENATAL GUMMIES/DHA & FA PO Take by mouth.        Discharge home in stable condition Infant Feeding: Breast Infant Disposition:NICU Discharge instruction: per After Visit Summary and Postpartum booklet. Activity: Advance as tolerated. Pelvic rest for 6 weeks.  Diet: routine diet Future Appointments: Future Appointments  Date Time Provider [place]  09/05/2019  9:45 AM [facility] None  09/09/2019 10:00 AM [facility] NURSE [facility] None  10/03/2019  9:45 AM Sloan Leiter, MD [place] None   Follow up Visit: Follow-up Information    [facility]. Go in 4 week(s).   Why: for postpartum visit Contact information: [address] [place] [phone]           Please schedule this patient for a Virtual postpartum visit in 4 weeks with the following provider: Any provider. Additional Postpartum F/U:Incision check 1 week  Low risk pregnancy complicated by: TOLAC Delivery mode:  C-Section, Low Transverse  Anticipated Birth Control:  Depo  Needs TOC for Chlamydia as outpatient.   09/04/2019 [place], DO   OB FELLOW ATTESTATION  I have seen and examined this patient and edited the above documentation in the resident's note to reflect any changes or updates.  Augustin Coupe, MD/[facility] OB Fellow  09/04/2019, 7:09 AM

## 2019-09-04 ENCOUNTER — Ambulatory Visit: Payer: Self-pay

## 2019-09-04 NOTE — Lactation Note (Signed)
This note was copied from a baby's chart. Lactation Consultation Note  Patient Name: Ruth Perry EHMCN'O Date: 09/04/2019 Reason for consult: Follow-up assessment;NICU baby;Term   Low blood sugars, IVF and bottle/gavage feedings LC in to assist with positioning and latching of baby.   Baby is 68 hrs old.  Mom not pumping regularly, last pumping was over 6 hrs ago.  Mom to pick up [facility] pump today as she was discharged.  Reminded Mom of importance of regular pumping to support a full milk supply. Mom knows to pump both breasts 15-30 mins >8 times per 24 hrs.  Encouraged Mom to do some breast massage and hand expression prior to baby's latch.  Unable to express any colostrum.  Assisted Mom with cross cradle and football holds.  Baby fussy and not interested in latching.  Mom speaking softly to reassure baby.  Oral digital stimulation done to encourage baby to suck.  Baby would barely suck on finger.  Initiated a 24 mm nipple shield, showing Mom how to correctly apply to pull her nipple into shield. Baby latched, but didn't suck.  He maintained a little suction on nipple shield, as Mom's milk noted in shield when he came off.  To avoid over stressing baby, recommended Mom place baby STS on her chest and wait until baby is ready.   Offered to open up another pump kit to enable Mom to pump at baby's bedside.  Mom had said her parts are in the car, Mom declined saying she would be leaving soon.   Mom knows to call lactation prn for assistance, or ask baby's RN to call for [facility] assist.  Feeding Feeding Type: Breast Fed  LATCH Score Latch: Too sleepy or reluctant, no latch achieved, no sucking elicited.  Audible Swallowing: None  Type of Nipple: Everted at rest and after stimulation  Comfort (Breast/Nipple): Soft / non-tender  Hold (Positioning): Full assist, staff holds infant at breast  LATCH Score: 4  Interventions Interventions: Breast feeding basics reviewed;Assisted with  latch;Breast massage;Hand express;Adjust position;Support pillows;Position options;DEBP  Lactation Tools Discussed/Used Tools: Pump;Nipple Shields Nipple shield size: 24 Breast pump type: Double-Electric Breast Pump   Consult Status Consult Status: Follow-up Date: 09/05/19 Follow-up type: In-patient    Broadus John 09/04/2019, 4:59 PM

## 2019-09-05 ENCOUNTER — Other Ambulatory Visit (HOSPITAL_COMMUNITY): Payer: Medicaid Other

## 2019-09-07 ENCOUNTER — Inpatient Hospital Stay (HOSPITAL_COMMUNITY): Admission: AD | Admit: 2019-09-07 | Payer: Medicaid Other | Source: Home / Self Care | Admitting: Family Medicine

## 2019-09-07 ENCOUNTER — Inpatient Hospital Stay (HOSPITAL_COMMUNITY): Payer: Medicaid Other

## 2019-09-09 ENCOUNTER — Ambulatory Visit: Payer: Medicaid Other

## 2019-10-03 ENCOUNTER — Telehealth: Payer: Medicaid Other | Admitting: Obstetrics and Gynecology

## 2019-10-03 ENCOUNTER — Other Ambulatory Visit: Payer: Self-pay

## 2020-03-18 DIAGNOSIS — U071 COVID-19: Secondary | ICD-10-CM | POA: Insufficient documentation

## 2020-03-18 HISTORY — DX: COVID-19: U07.1

## 2020-04-08 ENCOUNTER — Emergency Department (HOSPITAL_COMMUNITY)
Admission: EM | Admit: 2020-04-08 | Discharge: 2020-04-09 | Disposition: A | Discharge status: Home / Self Care | Payer: Medicaid Other | Attending: Emergency Medicine | Admitting: Emergency Medicine

## 2020-04-08 ENCOUNTER — Other Ambulatory Visit: Payer: Self-pay

## 2020-04-08 ENCOUNTER — Encounter (HOSPITAL_COMMUNITY): Payer: Self-pay | Admitting: Emergency Medicine

## 2020-04-08 DIAGNOSIS — R509 Fever, unspecified: Secondary | ICD-10-CM | POA: Diagnosis present

## 2020-04-08 DIAGNOSIS — U071 COVID-19: Secondary | ICD-10-CM | POA: Diagnosis not present

## 2020-04-08 DIAGNOSIS — Z5321 Procedure and treatment not carried out due to patient leaving prior to being seen by health care provider: Secondary | ICD-10-CM | POA: Diagnosis not present

## 2020-04-08 LAB — RESP PANEL BY RT-PCR (FLU A&B, COVID) ARPGX2
Influenza A by PCR: NEGATIVE
Influenza B by PCR: NEGATIVE
SARS Coronavirus 2 by RT PCR: POSITIVE — AB

## 2020-04-08 NOTE — ED Notes (Signed)
Pt called 3 x times no response  

## 2020-04-08 NOTE — ED Triage Notes (Signed)
Pt c/o fever, cough and runny nose x 3 days. Unvaccinated for covid, no known covid contacts.

## 2020-04-08 NOTE — ED Notes (Signed)
Pt called for vitals recheck, no response. Pt not seen in the waiting room.

## 2020-04-09 ENCOUNTER — Encounter: Payer: Self-pay | Admitting: Nurse Practitioner

## 2020-04-09 ENCOUNTER — Telehealth: Payer: Self-pay | Admitting: Nurse Practitioner

## 2020-04-09 DIAGNOSIS — J45909 Unspecified asthma, uncomplicated: Secondary | ICD-10-CM | POA: Insufficient documentation

## 2020-04-09 NOTE — Telephone Encounter (Signed)
I called Lenox Ahr to discuss Covid symptoms and the use of Sotrovimab, a monoclonal antibody infusion for those with mild to moderate Covid symptoms and at a high risk of hospitalization.     Pt is qualified for this infusion at the monoclonal antibody infusion center due to co-morbid conditions and/or a member of an at-risk group.  Unfortunately, I was unable to reach Ms. Ratcliffe as her phone went straight to voicemail, but her voicemail box in not set up.   Patient Active Problem List   Diagnosis Date Noted  . Morbid obesity (HCC)   . Asthma   . COVID-19 virus infection 03/2020  . Encounter for induction of labor 09/01/2019  . Chlamydia infection affecting pregnancy 05/09/2019  . History of cesarean delivery, currently pregnant 04/24/2019  . Obesity during pregnancy, antepartum 04/24/2019  . Supervision of other normal pregnancy, antepartum 03/26/2019  . Bleeding in early pregnancy 03/01/2019  . Trichomonal vaginitis during pregnancy 03/01/2019    Nicolasa Ducking, NP

## 2020-11-26 IMAGING — US US MFM OB DETAIL+14 WK
1 series · 13 of 28 positions shown · non-contrast
Comparison: none

[Series 1: us mfm ob detail+14 wk · 13 of 65 slices shown]
[im 3/65]
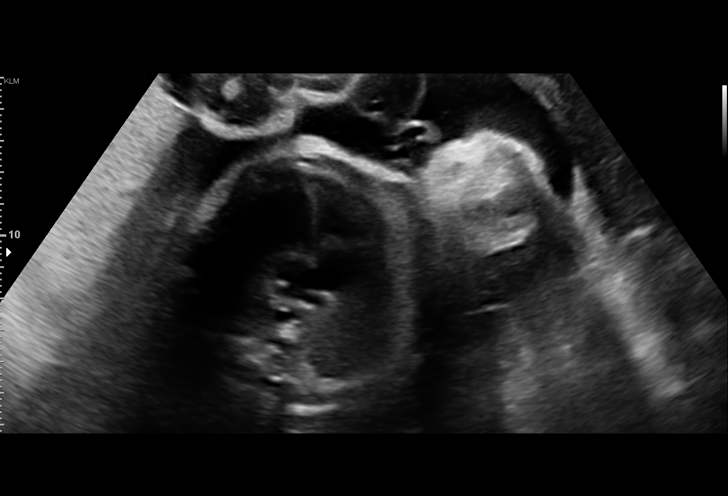
[im 8/65]
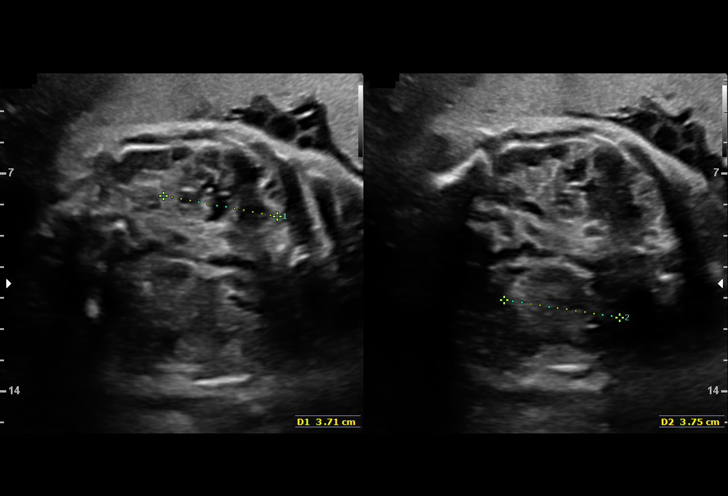
[im 12/65]
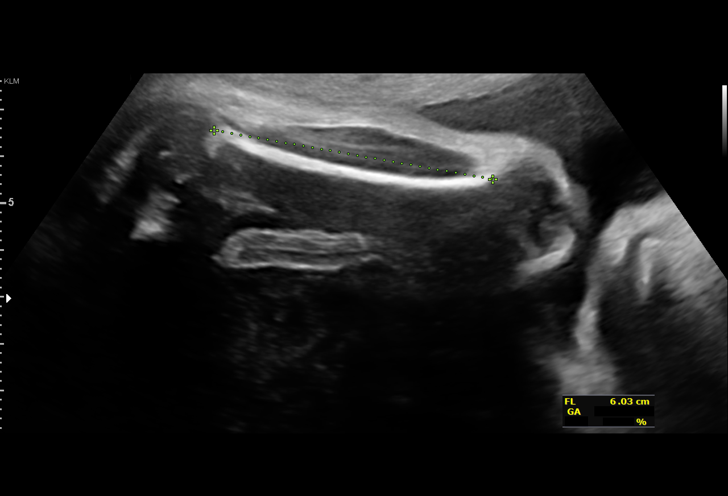
[im 17/65]
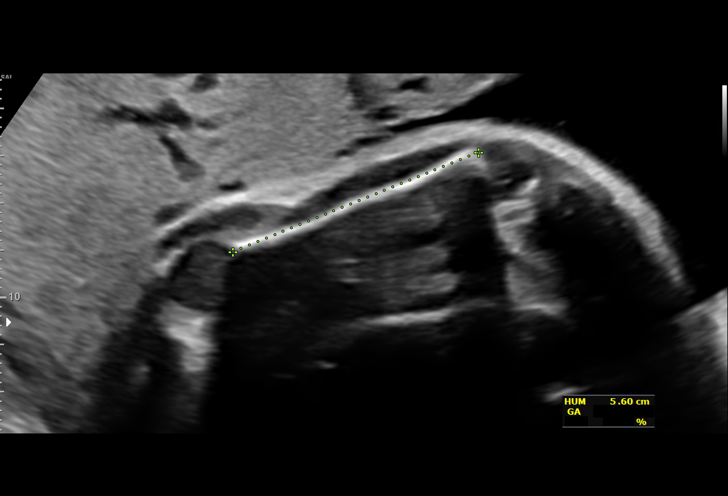
[im 22/65]
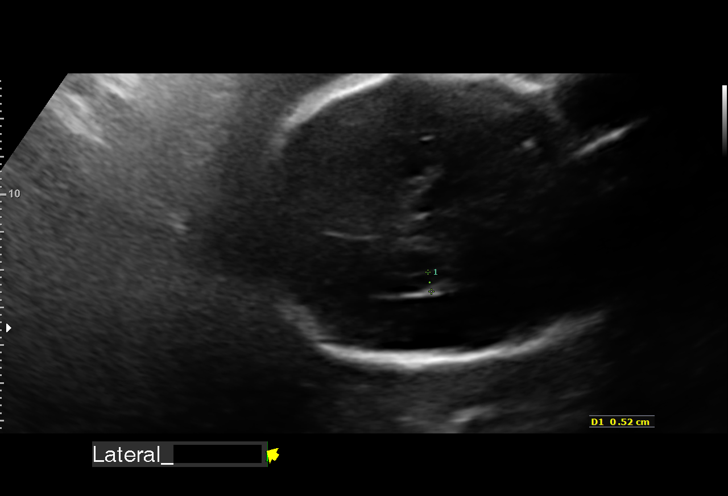
[im 27/65]
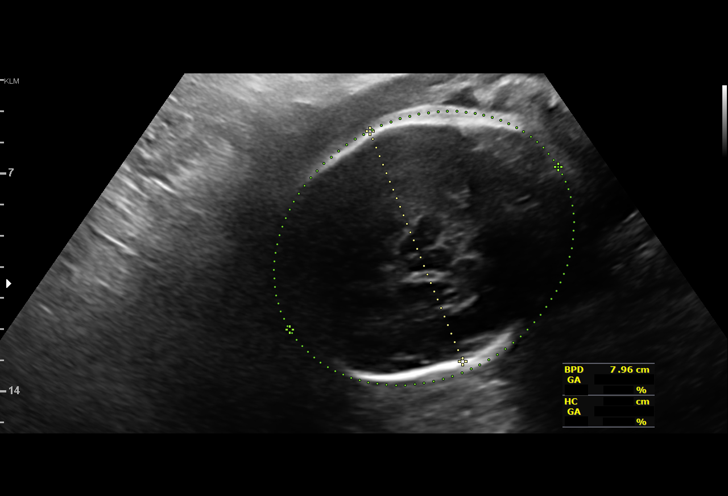
[im 34/65]
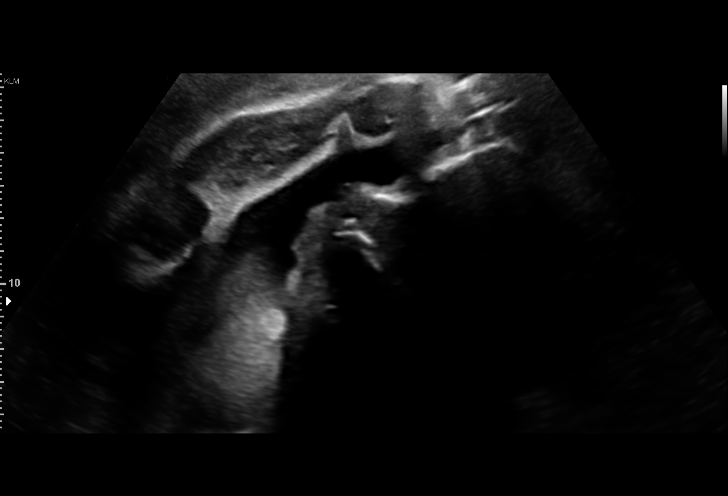
[im 38/65]
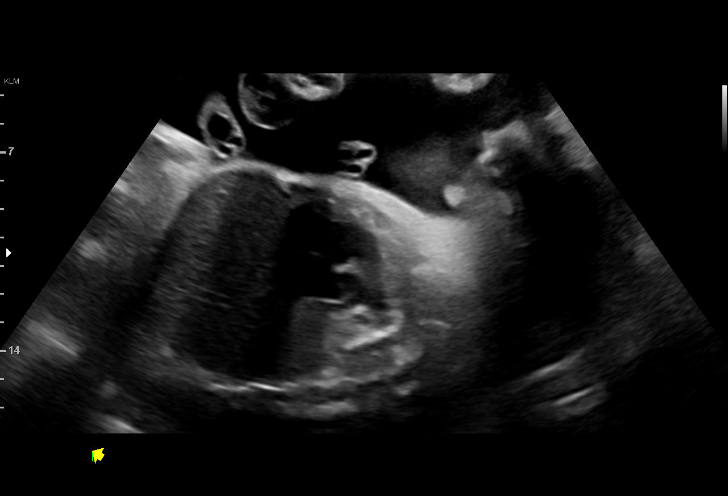
[im 43/65]
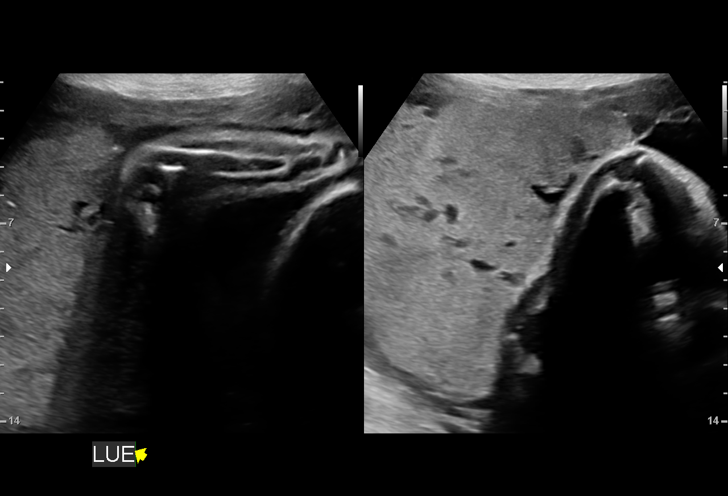
[im 48/65]
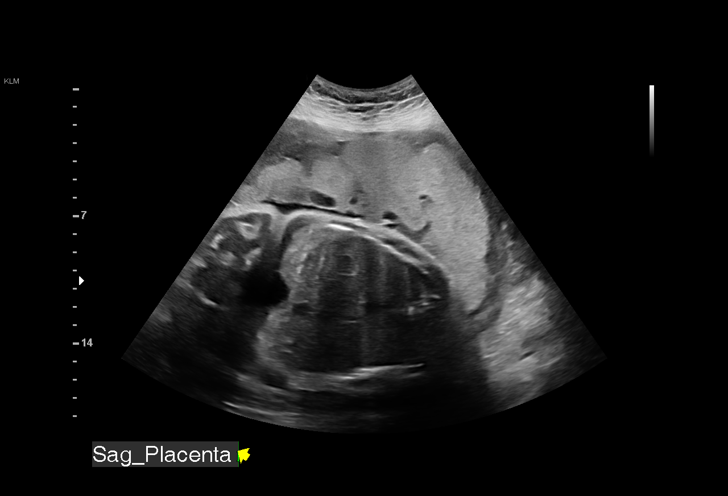
[im 53/65]
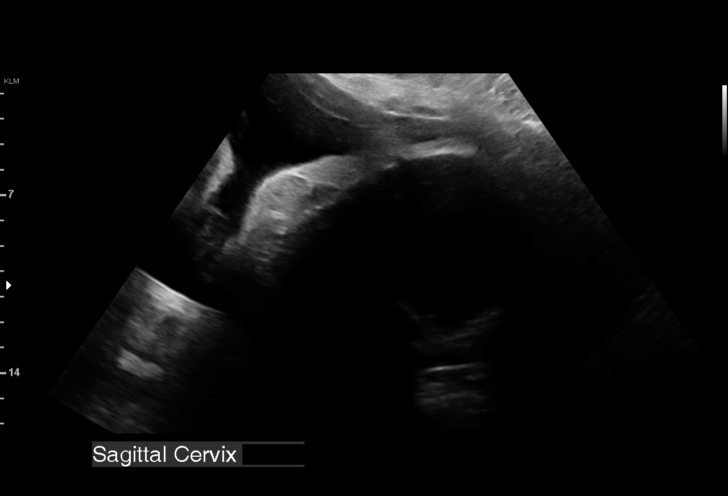
[im 57/65]
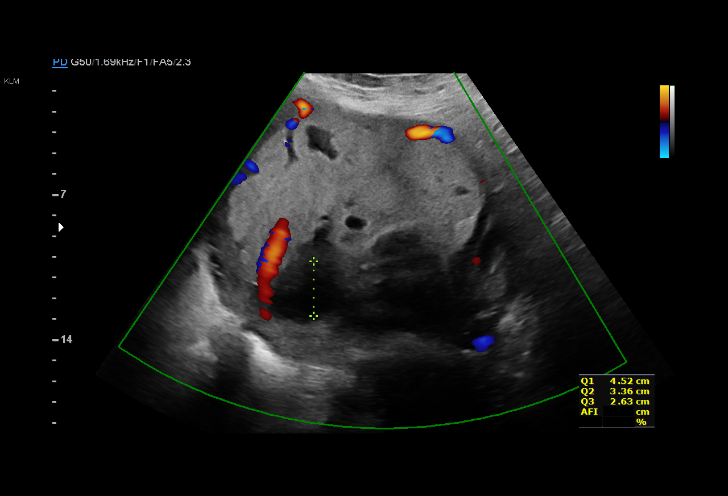
[im 62/65]
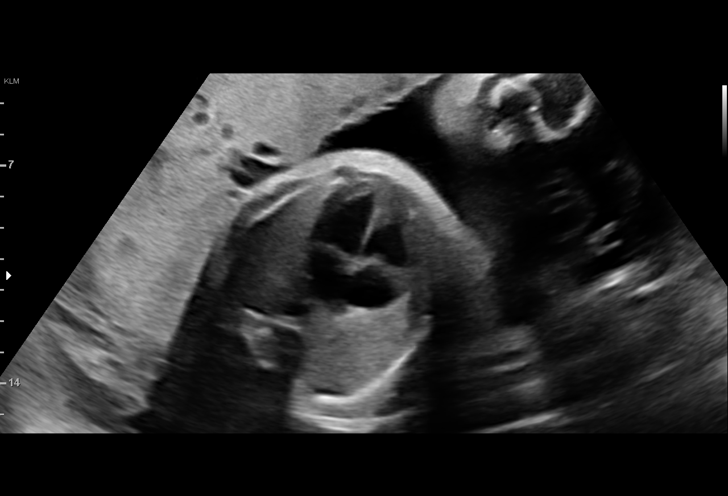

[13 of 28 positions shown; findings below may reference images not displayed]

JULA CNM                               [HOSPITAL]
 Ref. Address:     Faculty

 ----------------------------------------------------------------------

 ----------------------------------------------------------------------
Indications

  Late to prenatal care, third trimester
  Obesity complicating pregnancy, third
  trimester (pre-pregnancy BMI 36.5)
  32 weeks gestation of pregnancy
  Encounter for antenatal screening for
  malformations (low Risk NIPS)
 ----------------------------------------------------------------------
Fetal Evaluation

 Num Of Fetuses:         1
 Fetal Heart Rate(bpm):  129
 Cardiac Activity:       Observed
 Presentation:           Cephalic
 Placenta:               Anterior
 P. Cord Insertion:      Previously Visualized

 Amniotic Fluid
 AFI FV:      Within normal limits

 AFI Sum(cm)     %Tile       Largest Pocket(cm)
 14.21           49

 RUQ(cm)       RLQ(cm)       LUQ(cm)        LLQ(cm)

Biometry

 BPD:      80.2  mm     G. Age:  32w 1d         31  %    CI:        75.68   %    70 - 86
                                                         FL/HC:      20.9   %    19.9 -
 HC:      292.3  mm     G. Age:  32w 2d         10  %    HC/AC:      0.96        0.96 -
 AC:      303.2  mm     G. Age:  34w 2d         91  %    FL/BPD:     76.2   %    71 - 87
 FL:       61.1  mm     G. Age:  31w 5d         17  %    FL/AC:      20.2   %    20 - 24
 HUM:      55.3  mm     G. Age:  32w 1d         47  %
 CER:      39.2  mm     G. Age:  33w 1d         61  %

 Est. FW:    6323  gm    4 lb 11 oz      59  %
OB History

 Gravidity:    2         Term:   1        Prem:   0        SAB:   0
 TOP:          0       Ectopic:  0        Living: 1
Gestational Age

 LMP:           30w 6d        Date:  12/06/18                 EDD:   09/12/19
 U/S Today:     32w 4d                                        EDD:   08/31/19
 Best:          32w 4d     Det. By:  U/S (07/10/19)           EDD:   08/31/19
Anatomy

 Cranium:               Appears normal         Aortic Arch:            Not well visualized
 Cavum:                 Appears normal         Ductal Arch:            Appears normal
 Ventricles:            Appears normal         Diaphragm:              Appears normal
 Choroid Plexus:        Appears normal         Stomach:                Appears normal, left
                                                                       sided
 Cerebellum:            Appears normal         Abdomen:                Appears normal
 Posterior Fossa:       Appears normal         Abdominal Wall:         Not well visualized
 Nuchal Fold:           Not applicable (>20    Cord Vessels:           Appears normal (3
                        wks GA)                                        vessel cord)
 Face:                  Appears normal         Kidneys:                Appear normal
                        (orbits and profile)
 Lips:                  Appears normal         Bladder:                Appears normal
 Thoracic:              Appears normal         Spine:                  Not well visualized
 Heart:                 Appears normal         Upper Extremities:      Visualized
                        (4CH, axis, and
                        situs)
 RVOT:                  Not well visualized    Lower Extremities:      Visualized
 LVOT:                  Appears normal

 Other:  Technicallly difficult due to advanced GA and maternal habitus.
Cervix Uterus Adnexa

 Cervix
 Not visualized (advanced GA >44wks)

 Adnexa
 No abnormality visualized.
Comments

 This patient was seen for a detailed fetal anatomy scan due
 to maternal obesity and as she presented late for prenatal
 care.  The patient reports that she had an [facility]
 [facility]  This is her second
 pregnancy.  She reports one prior full-term cesarean delivery.
 She denies other significant past medical history and denies
 any problems in her current pregnancy.
 Based on the fetal biometry measurements obtained today,
 her EDC should be August 31, 2019.  The patient reports that
 her prior EDC September 12, 2019 was based on the an
 uncertain last menstrual period.
 The views of the fetal anatomy were limited today due to her
 advanced gestational age.
 The patient was informed that anomalies may be missed due
 to technical limitations. If the fetus is in a suboptimal position
 or maternal habitus is increased, visualization of the fetus in
 the maternal uterus may be impaired.
 A follow-up exam was scheduled in 4 weeks to assess the
 fetal growth and to confirm her dates.  We changed her EDC
 to August 31, 2019 today.

## 2020-12-04 IMAGING — US US MFM FETAL BPP W/O NON-STRESS
1 series · 15 of 17 positions shown · non-contrast
Comparison: none

[Series 1: us mfm fetal bpp w/o non-stress · 17 acquisitions, 15 frames shown]
[im 1/17]
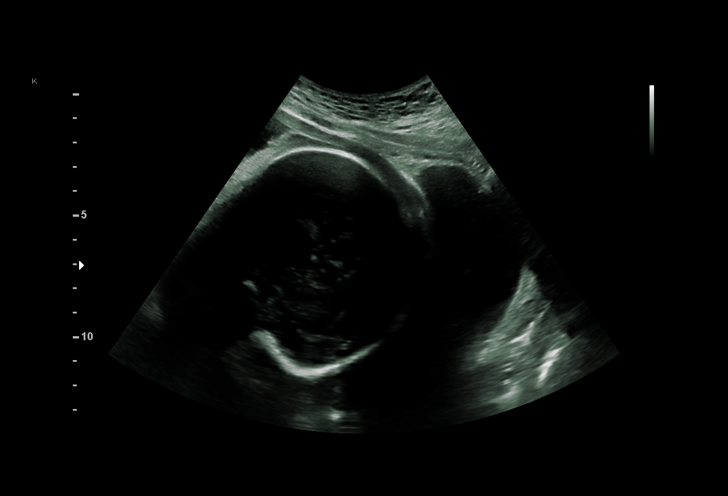
[im 2/17]
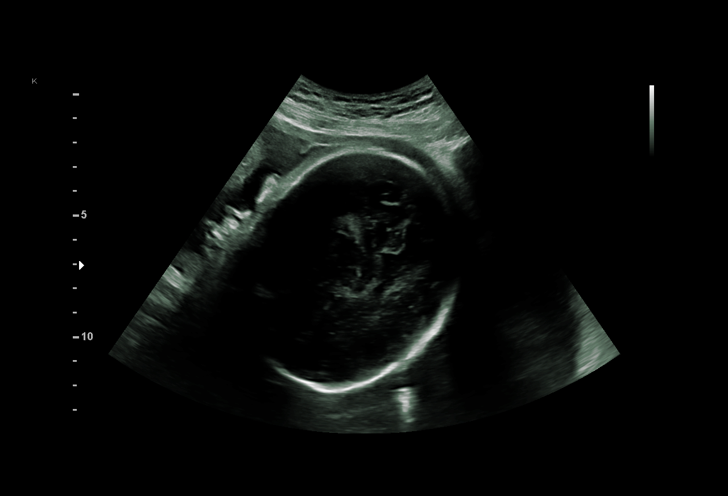
[im 3/17]
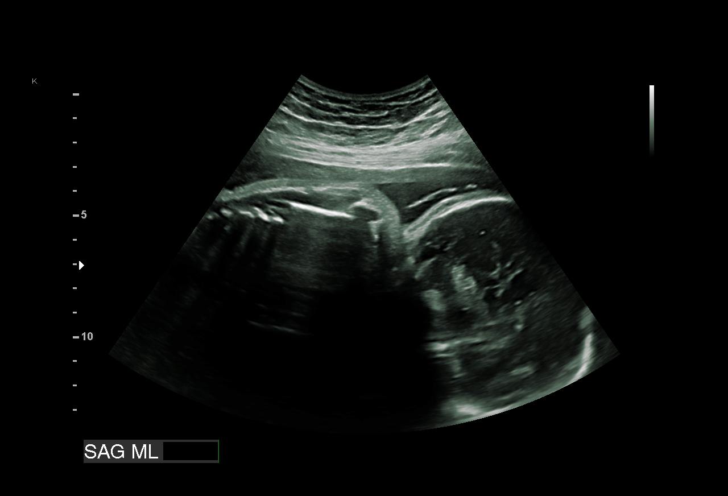
[im 4/17]
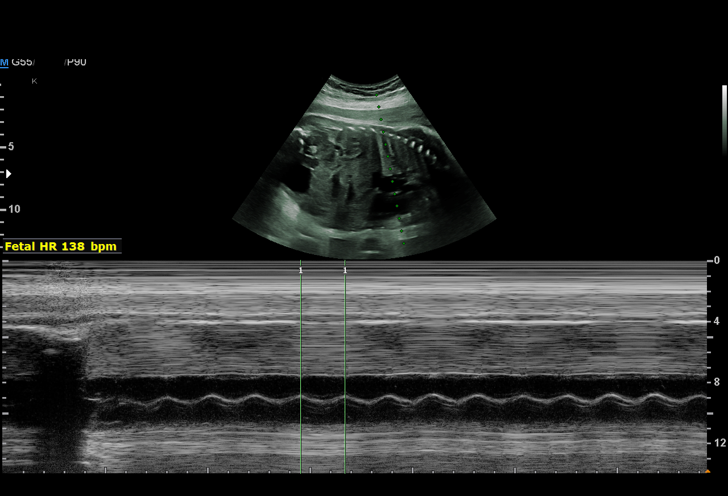
[im 6/17]
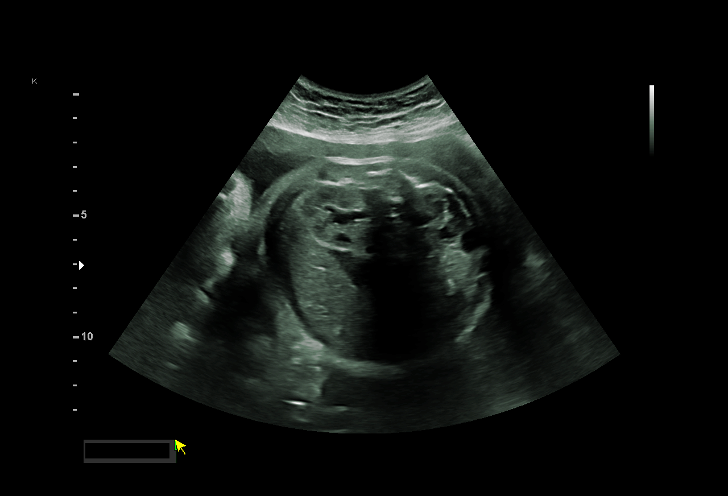
[im 7/17]
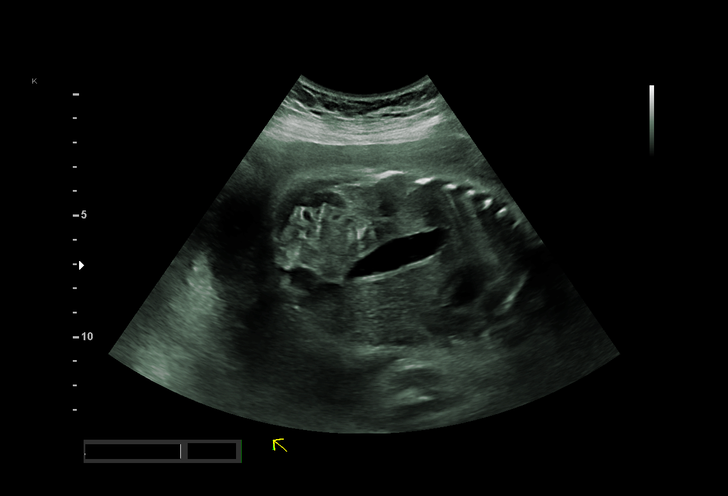
[im 8/17]
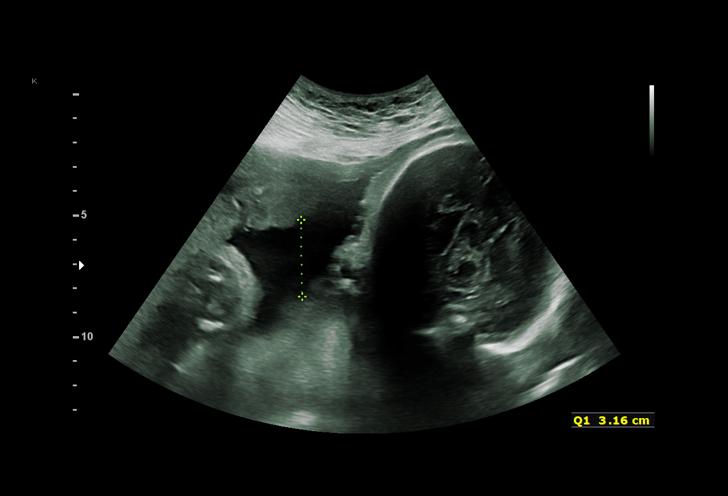
[im 9/17]
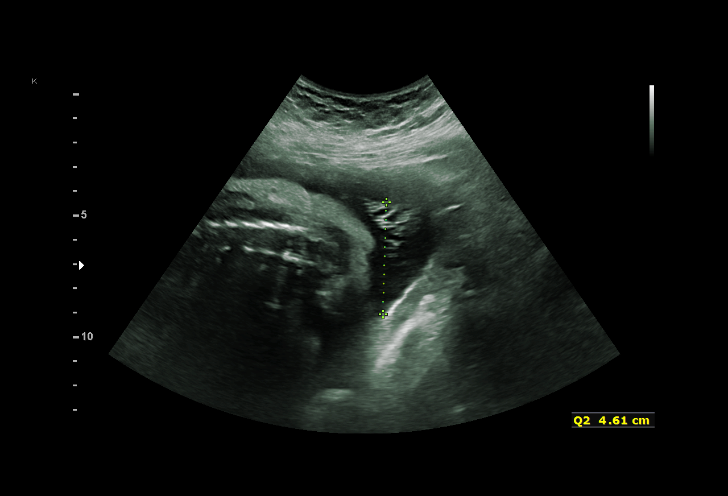
[im 10/17]
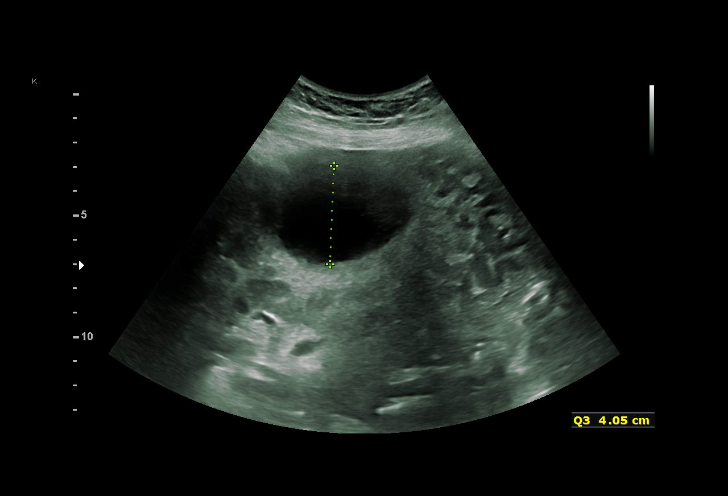
[im 11/17]
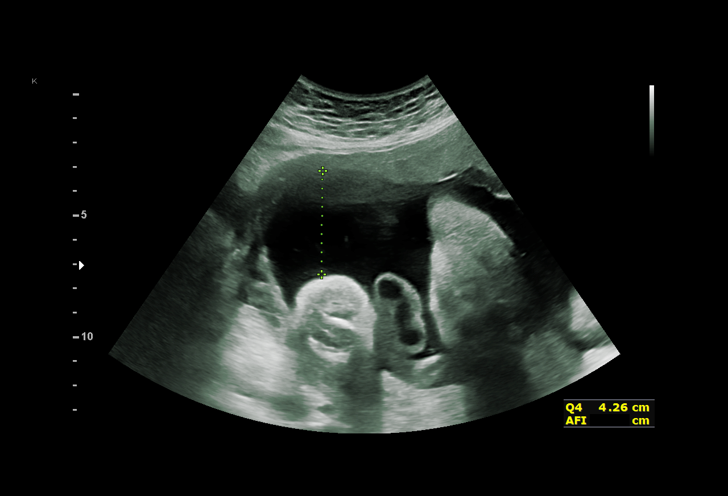
[im 12/17]
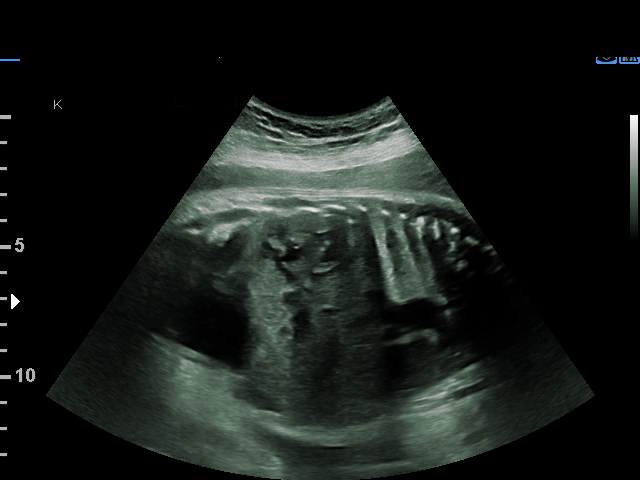
[im 14/17]
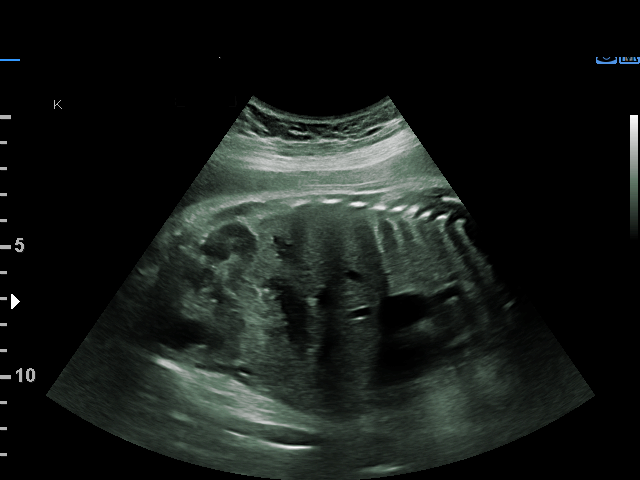
[im 15/17]
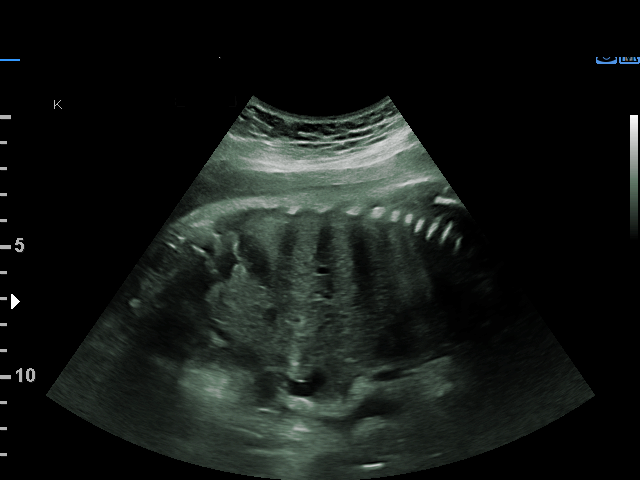
[im 16/17]
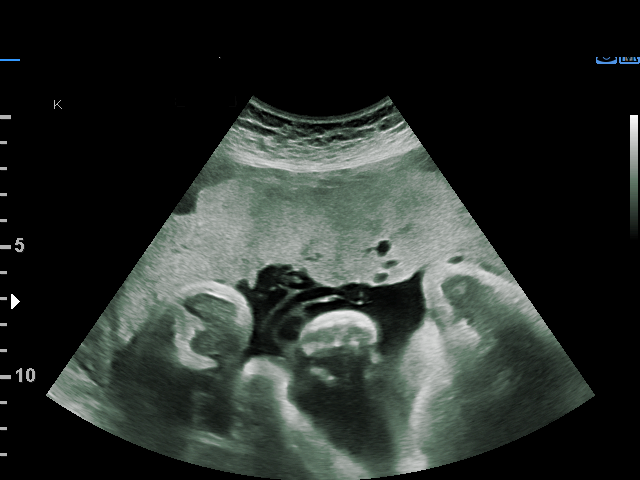
[im 17/17]
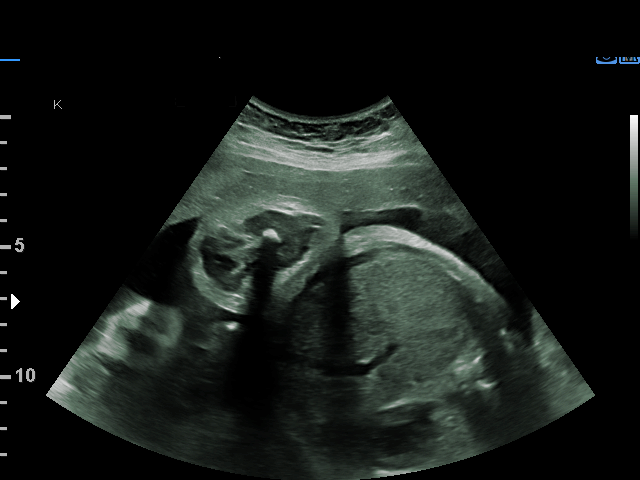

[15 of 17 positions shown; findings below may reference images not displayed]

Referred By:      [facility] MAU/Triage         [facility]

 ----------------------------------------------------------------------

 ----------------------------------------------------------------------
Indications

  Non-reactive NST
  33 weeks gestation of pregnancy
  Late to prenatal care, third trimester
  Obesity complicating pregnancy, third
  trimester (pre-pregnancy BMI 36.5)
 ----------------------------------------------------------------------
Fetal Evaluation

 Num Of Fetuses:          1
 Fetal Heart Rate(bpm):   138
 Cardiac Activity:        Observed
 Presentation:            Cephalic

 Amniotic Fluid
 AFI FV:      Within normal limits

 AFI Sum(cm)     %Tile       Largest Pocket(cm)
 16.08           58

 RUQ(cm)       RLQ(cm)       LUQ(cm)        LLQ(cm)

Biophysical Evaluation

 Amniotic F.V:   Within normal limits       F. Tone:         Observed
 F. Movement:    Observed                   Score:           [DATE]
 F. Breathing:   Observed
OB History

 Gravidity:    2         Term:   1        Prem:   0        SAB:   0
 TOP:          0       Ectopic:  0        Living: 1
Gestational Age

 LMP:           32w 0d        Date:  12/06/18                 EDD:   09/12/19
 Best:          33w 5d     Det. By:  U/S  (07/10/19)          EDD:   08/31/19
Anatomy

 Stomach:               Appears normal, left   Kidneys:                Appear normal
                        sided
 Cord Vessels:          Appears normal (3      Bladder:                Appears normal
                        vessel cord)
Comments

 A biophysical profile performed today was [DATE].
 There was normal amniotic fluid noted on today's ultrasound
 exam.

## 2021-07-01 ENCOUNTER — Ambulatory Visit (INDEPENDENT_AMBULATORY_CARE_PROVIDER_SITE_OTHER): Payer: Medicaid Other | Admitting: Emergency Medicine

## 2021-07-01 MED ORDER — VITAFOL GUMMIES 3.33-0.333-34.8 MG PO CHEW: 1.0000 | CHEWABLE_TABLET | Freq: Every day | ORAL | 11 refills | Status: DC

## 2021-07-01 MED ORDER — BLOOD PRESSURE KIT DEVI: 1.0000 | 0 refills | Status: DC

## 2021-07-01 MED ORDER — GOJJI WEIGHT SCALE MISC: 1.0000 | 0 refills | Status: DC

## 2021-07-01 NOTE — Progress Notes (Signed)
New OB Intake ? ?I connected with  Ruth Perry on 07/01/21 at  9:00 AM EDT by telephone Video Visit and verified that I am speaking with the correct person using two identifiers. Nurse is located at [facility] and pt is located at patient's home. ? ?I discussed the limitations, risks, security and privacy concerns of performing an evaluation and management service by telephone and the availability of in person appointments. I also discussed with the patient that there may be a patient responsible charge related to this service. The patient expressed understanding and agreed to proceed. ? ?I explained I am completing New OB Intake today. We discussed her EDD of 11/11/21 that is based on LMP of 02/04/2022. Pt is G3P2002. I reviewed her allergies, medications, Medical/Surgical/OB history, and appropriate screenings. I informed her of [facility] services. Based on history, this is a/an  pregnancy uncomplicated .  ? ?Patient Active Problem List  ? Diagnosis Date Noted  ? Morbid obesity ([place])   ? Asthma   ? COVID-19 virus infection 03/2020  ? Encounter for induction of labor 09/01/2019  ? Chlamydia infection affecting pregnancy 05/09/2019  ? History of cesarean delivery, currently pregnant 04/24/2019  ? Obesity during pregnancy, antepartum 04/24/2019  ? Supervision of other normal pregnancy, antepartum 03/26/2019  ? Bleeding in early pregnancy 03/01/2019  ? Trichomonal vaginitis during pregnancy 03/01/2019  ? ? ?Concerns addressed today ? ?Delivery Plans:  ?Plans to deliver at [facility].  ? ?MyChart/Babyscripts ?MyChart access verified. I explained pt will have some visits in office and some virtually. Babyscripts instructions given and order placed. Patient verifies receipt of registration text/e-mail. Account successfully created and app downloaded. ? ?Blood Pressure Cuff  ?Blood pressure cuff ordered for patient to pick-up from [employer]. Explained after first prenatal appt pt will check weekly and document in  5. ? ?Weight scale: Patient does / does not  have weight scale. Weight scale ordered for patient to pick up from [employer].  ? ?Anatomy US ?Explained first scheduled [place] will be scheduled at new OB visit.  ? ?Labs ?Discussed Johnsie Cancel genetic screening with patient. Would like both Panorama and Horizon drawn at new OB visit.Also if interested in genetic testing, tell patient she will need AFP 15-21 weeks to complete genetic testing .Routine prenatal labs needed. ? ?Covid Vaccine ?Patient has not covid vaccine.  ? ? ?Social Determinants of Health ?Food Insecurity: Patient expresses food insecurity. Food Market information given to patient; explained patient may visit at the end of first OB appointment. ?WIC Referral: Patient is interested in referral to [facility].  ?Transportation: Patient denies transportation needs. ?Childcare: Discussed no children allowed at ultrasound appointments. Offered childcare services; patient expresses need for childcare services. Childcare scheduled for appropriate appointments and information given to patient. ? ?Send link to Pregnancy Navigators ? ? ?Placed OB Box on problem list and updated ? ?First visit review ?I reviewed new OB appt with pt. I explained she will have a pelvic exam, ob bloodwork with genetic screening, and PAP smear. Explained pt will be seen by Lynnda Shields at first visit; encounter routed to appropriate provider. Explained that patient will be seen by pregnancy navigator following visit with provider. [facility] information placed in AVS.  ? ?Barkley Boards, RN ?07/01/2021  9:12 AM  ?

## 2021-07-01 NOTE — Addendum Note (Signed)
Addended by: Sharlyne Pacas on: 07/01/2021 11:17 AM ? ? Modules accepted: Orders ? ?

## 2021-07-01 NOTE — Progress Notes (Signed)
Patient was assessed and managed by nursing staff during this encounter. I have reviewed the chart and agree with the documentation and plan. I have also made any necessary editorial changes. ? ?Griffin Basil, MD ?07/01/2021 10:29 AM   ?

## 2021-07-08 ENCOUNTER — Ambulatory Visit (INDEPENDENT_AMBULATORY_CARE_PROVIDER_SITE_OTHER): Payer: Medicaid Other | Admitting: Obstetrics and Gynecology

## 2021-07-08 ENCOUNTER — Other Ambulatory Visit: Payer: Self-pay

## 2021-07-08 ENCOUNTER — Other Ambulatory Visit (HOSPITAL_COMMUNITY)
Admission: RE | Admit: 2021-07-08 | Discharge: 2021-07-08 | Disposition: A | Discharge status: Home / Self Care | Payer: Medicaid Other | Source: Ambulatory Visit | Attending: Obstetrics and Gynecology | Admitting: Obstetrics and Gynecology

## 2021-07-08 VITALS — BP 113/74 | HR 101 | Wt 238.0 lb

## 2021-07-08 DIAGNOSIS — O99210 Obesity complicating pregnancy, unspecified trimester: Secondary | ICD-10-CM

## 2021-07-08 DIAGNOSIS — Z3480 Encounter for supervision of other normal pregnancy, unspecified trimester: Secondary | ICD-10-CM

## 2021-07-08 DIAGNOSIS — Z3A22 22 weeks gestation of pregnancy: Secondary | ICD-10-CM

## 2021-07-08 DIAGNOSIS — O0990 Supervision of high risk pregnancy, unspecified, unspecified trimester: Secondary | ICD-10-CM

## 2021-07-08 DIAGNOSIS — O0932 Supervision of pregnancy with insufficient antenatal care, second trimester: Secondary | ICD-10-CM

## 2021-07-08 DIAGNOSIS — O34219 Maternal care for unspecified type scar from previous cesarean delivery: Secondary | ICD-10-CM

## 2021-07-08 DIAGNOSIS — Z6838 Body mass index (BMI) 38.0-38.9, adult: Secondary | ICD-10-CM

## 2021-07-08 NOTE — Progress Notes (Signed)
? ?INITIAL PRENATAL VISIT NOTE ? ?Subjective:  ?Ruth Perry is a 31 y.o. G3P2002 at 89w0dby unsure LMP being seen today for her initial prenatal visit. She has an obstetric history significant for cesarean section x 2. She has a medical history significant for maternal obesity. ? ?Patient reports no complaints.  Contractions: Not present. Vag. Bleeding: None.  Movement: Present. Denies leaking of fluid.  ? ? ?Past Medical History:  ?Diagnosis Date  ? Asthma   ? as a child  ? COVID-19 virus infection 03/2020  ? Morbid obesity ([place]   ? ? ?Past Surgical History:  ?Procedure Laterality Date  ? CESAREAN SECTION    ? CESAREAN SECTION N/A 09/01/2019  ? Procedure: CESAREAN SECTION;  Surgeon: DSloan Leiter MD;  [facility];  Service: Obstetrics;  Laterality: N/A;  ? CHOLECYSTECTOMY    ? ? ?OB History  ?Gravida Para Term Preterm AB Living  ?3 2 2     2   ?SAB IAB Ectopic Multiple Live Births  ?      0 2  ?  ?# Outcome Date GA Lbr Len/2nd Weight Sex Delivery Anes PTL Lv  ?3 Current           ?2 Term 09/01/19 431w1d8 lb 8.6 oz (3.873 kg) M CS-LTranv EPI  LIV  ?1 Term 04/01/13 3774w5d lb 6 oz (2.892 kg) M CS-LTranv   LIV  ? ? ?Social History  ? ?Socioeconomic History  ? Marital status: Single  ?  Spouse name: Not on file  ? Number of children: Not on file  ? Years of education: Not on file  ? Highest education level: Not on file  ?Occupational History  ? Not on file  ?Tobacco Use  ? Smoking status: Never  ? Smokeless tobacco: Never  ?Vaping Use  ? Vaping Use: Never used  ?Substance and Sexual Activity  ? Alcohol use: Never  ? Drug use: Never  ? Sexual activity: Yes  ?  Partners: Male  ?  Birth control/protection: None  ?Other Topics Concern  ? Not on file  ?Social History Narrative  ? Not on file  ? ?Social Determinants of Health  ? ?Financial Resource Strain: Not on file  ?Food Insecurity: Not on file  ?Transportation Needs: Not on file  ?Physical Activity: Not on file  ?Stress: Not on file  ?Social Connections:  Not on file  ? ? ?Family History  ?Problem Relation Age of Onset  ? Heart attack Mother   ? Rheum arthritis Father   ? ? ? ?Current Outpatient Medications:  ?  albuterol (VENTOLIN HFA) 108 (90 Base) MCG/ACT inhaler, Inhale 2 puffs into the lungs every 6 (six) hours as needed for wheezing or shortness of breath., Disp: 6.7 g, Rfl: 0 ?  Prenatal MV-Min-FA-Omega-3 (PRENATAL GUMMIES/DHA & FA PO), Take by mouth., Disp: , Rfl:  ?  Blood Pressure Monitoring (BLOOD PRESSURE KIT) DEVI, 1 Device by Does not apply route once a week., Disp: 1 each, Rfl: 0 ?  ibuprofen (ADVIL) 800 MG tablet, Take 1 tablet (800 mg total) by mouth every 6 (six) hours. (Patient not taking: Reported on 07/01/2021), Disp: 30 tablet, Rfl: 0 ?  Misc. Devices (GOJJI WEIGHT SCALE) MISC, 1 Device by Does not apply route once a week., Disp: 1 each, Rfl: 0 ?  Prenatal Vit-Fe Phos-FA-Omega (VITAFOL GUMMIES) 3.33-0.333-34.8 MG CHEW, Chew 1 tablet by mouth daily. (Patient not taking: Reported on 07/08/2021), Disp: 30 tablet, Rfl: 11 ? ?No Known  Allergies ? ?Review of Systems: Negative except for what is mentioned in HPI. ? ?Objective:  ? ?Vitals:  ? 07/08/21 1006  ?BP: 113/74  ?Pulse: (!) 101  ?Weight: 238 lb (108 kg)  ? ? ?Fetal Status: Fetal Heart Rate (bpm): 142   Movement: Present    ? ?Physical Exam: ?BP 113/74   Pulse (!) 101   Wt 238 lb (108 kg)   LMP 02/04/2021 (Approximate)   BMI 43.53 kg/m?  ?CONSTITUTIONAL: Well-developed, well-nourished female in no acute distress.  ?NEUROLOGIC: Alert and oriented to person, place, and time. Normal reflexes, muscle tone coordination. No cranial nerve deficit noted. ?PSYCHIATRIC: Normal mood and affect. Normal behavior. Normal judgment and thought content. ?SKIN: Skin is warm and dry. No rash noted. Not diaphoretic. No erythema. No pallor. ?HENT:  Normocephalic, atraumatic, External right and left ear normal. Oropharynx is clear and moist ?EYES: Conjunctivae and EOM are normal.  ?NECK: Normal range of motion,  supple, no masses ?CARDIOVASCULAR: Normal heart rate noted, regular rhythm ?RESPIRATORY: Effort and breath sounds normal, no problems with respiration noted ?BREASTS:deferred ?ABDOMEN: Soft, nontender, nondistended, gravid., c section scar noted, obese ?GU: normal appearing external female genitalia, multiparous normal appearing cervix, scant white discharge in vagina, no lesions noted ?Bimanual: 24 weeks sized uterus, no adnexal tenderness or palpable lesions noted ?MUSCULOSKELETAL: Normal range of motion. ?EXT:  No edema and no tenderness. 2+ distal pulses. ? ? ?Assessment and Plan:  ?Pregnancy: G3P2002 at 83w0dby unsure LMP ? ?1. [redacted] weeks gestation of pregnancy ? ? ?2. Supervision of other normal pregnancy, antepartum ?Pt has anatomy/dating scan on 07/09/21, [facility] ?Continue routine care ? ?- CBC/D/Plt+RPR+Rh+ABO+RubIgG... ?- Culture, OB Urine ?- Genetic Screening ?- Cytology - PAP ?- Cervicovaginal ancillary only( [facility]) ?- Hemoglobin A1c ? ?3. History of cesarean delivery, currently pregnant ?Pt has c section x 2, leaning toward repeat with BTL ? ?4. Obesity during pregnancy, antepartum ? ? ?5. BMI 38.0-38.9,adult ? ? ?6. Late prenatal care complicating pregnancy in second trimester ? ? ? ?Preterm labor symptoms and general obstetric precautions including but not limited to vaginal bleeding, contractions, leaking of fluid and fetal movement were reviewed in detail with the patient. ? ?Please refer to After Visit Summary for other counseling recommendations.  ? ?Return in about 4 weeks (around 08/05/2021) for ROB, in person. ? ?LGriffin Basil?07/08/2021 ?11:01 AM  ?

## 2021-07-08 NOTE — Progress Notes (Signed)
New OB, 22.[redacted] wks GA ?OB Panel, OB Urine, Pap, GC/CC today ?Genetic testing offered and accepted ?Anatomy scan scheduled for tomorrow ?Depression and anxiety screen negative at [facility] Intake ?

## 2021-07-09 ENCOUNTER — Encounter: Payer: Self-pay | Admitting: *Deleted

## 2021-07-09 ENCOUNTER — Other Ambulatory Visit: Payer: Self-pay | Admitting: *Deleted

## 2021-07-09 ENCOUNTER — Ambulatory Visit: Payer: Medicaid Other | Admitting: *Deleted

## 2021-07-09 ENCOUNTER — Ambulatory Visit: Payer: Medicaid Other | Attending: Obstetrics and Gynecology

## 2021-07-09 VITALS — BP 124/67 | HR 92

## 2021-07-09 DIAGNOSIS — O99212 Obesity complicating pregnancy, second trimester: Secondary | ICD-10-CM | POA: Diagnosis not present

## 2021-07-09 DIAGNOSIS — O0990 Supervision of high risk pregnancy, unspecified, unspecified trimester: Secondary | ICD-10-CM

## 2021-07-09 DIAGNOSIS — Z3A23 23 weeks gestation of pregnancy: Secondary | ICD-10-CM | POA: Insufficient documentation

## 2021-07-09 DIAGNOSIS — Z363 Encounter for antenatal screening for malformations: Secondary | ICD-10-CM | POA: Diagnosis present

## 2021-07-09 DIAGNOSIS — Z3480 Encounter for supervision of other normal pregnancy, unspecified trimester: Secondary | ICD-10-CM | POA: Diagnosis not present

## 2021-07-09 DIAGNOSIS — Z362 Encounter for other antenatal screening follow-up: Secondary | ICD-10-CM

## 2021-07-09 LAB — CBC/D/PLT+RPR+RH+ABO+RUBIGG...
Antibody Screen: NEGATIVE
Basophils Absolute: 0 10*3/uL (ref 0.0–0.2)
Basos: 0 %
EOS (ABSOLUTE): 0.1 10*3/uL (ref 0.0–0.4)
Eos: 1 %
HCV Ab: NONREACTIVE
HIV Screen 4th Generation wRfx: NONREACTIVE
Hematocrit: 35 % (ref 34.0–46.6)
Hemoglobin: 11.5 g/dL (ref 11.1–15.9)
Hepatitis B Surface Ag: NEGATIVE
Immature Grans (Abs): 0.1 10*3/uL (ref 0.0–0.1)
Immature Granulocytes: 1 %
Lymphocytes Absolute: 3.2 10*3/uL — ABNORMAL HIGH (ref 0.7–3.1)
Lymphs: 22 %
MCH: 27.5 pg (ref 26.6–33.0)
MCHC: 32.9 g/dL (ref 31.5–35.7)
MCV: 84 fL (ref 79–97)
Monocytes Absolute: 0.8 10*3/uL (ref 0.1–0.9)
Monocytes: 5 %
Neutrophils Absolute: 10.4 10*3/uL — ABNORMAL HIGH (ref 1.4–7.0)
Neutrophils: 71 %
Platelets: 312 10*3/uL (ref 150–450)
RBC: 4.18 x10E6/uL (ref 3.77–5.28)
RDW: 13.9 % (ref 11.7–15.4)
RPR Ser Ql: NONREACTIVE
Rh Factor: POSITIVE
Rubella Antibodies, IGG: 1.05 index (ref >0.99)
WBC: 14.6 10*3/uL — ABNORMAL HIGH (ref 3.4–10.8)

## 2021-07-09 LAB — HEMOGLOBIN A1C
Est. average glucose Bld gHb Est-mCnc: 117 mg/dL
Hgb A1c MFr Bld: 5.7 % — ABNORMAL HIGH (ref 4.8–5.6)

## 2021-07-09 LAB — CERVICOVAGINAL ANCILLARY ONLY
Chlamydia: NEGATIVE
Comment: NEGATIVE
Comment: NEGATIVE
Comment: NORMAL
Neisseria Gonorrhea: NEGATIVE
Trichomonas: NEGATIVE

## 2021-07-09 LAB — HCV INTERPRETATION

## 2021-07-10 LAB — CULTURE, OB URINE

## 2021-07-10 LAB — URINE CULTURE, OB REFLEX

## 2021-07-12 LAB — CYTOLOGY - PAP
Diagnosis: NEGATIVE
Diagnosis: REACTIVE

## 2021-07-15 ENCOUNTER — Encounter: Payer: Self-pay | Admitting: Obstetrics and Gynecology

## 2021-08-05 ENCOUNTER — Ambulatory Visit (INDEPENDENT_AMBULATORY_CARE_PROVIDER_SITE_OTHER): Payer: Medicaid Other | Admitting: Obstetrics and Gynecology

## 2021-08-05 ENCOUNTER — Encounter: Payer: Self-pay | Admitting: Obstetrics and Gynecology

## 2021-08-05 VITALS — BP 130/68 | HR 99 | Wt 258.0 lb

## 2021-08-05 DIAGNOSIS — O34219 Maternal care for unspecified type scar from previous cesarean delivery: Secondary | ICD-10-CM

## 2021-08-05 DIAGNOSIS — Z3A27 27 weeks gestation of pregnancy: Secondary | ICD-10-CM

## 2021-08-05 DIAGNOSIS — Z3480 Encounter for supervision of other normal pregnancy, unspecified trimester: Secondary | ICD-10-CM

## 2021-08-05 DIAGNOSIS — O99210 Obesity complicating pregnancy, unspecified trimester: Secondary | ICD-10-CM

## 2021-08-05 NOTE — Progress Notes (Signed)
Pt presents for ROB denies complaints.  ?BTL consent signed today. ?

## 2021-08-05 NOTE — Progress Notes (Signed)
? ?  LOW-RISK PREGNANCY OFFICE VISIT ?Patient name: Ruth Perry MRN [id number]  Date of birth: January 22, 1991 ?Chief Complaint:   ?Routine Prenatal Visit ? ?History of Present Illness:   ?Ruth Perry is a 31 y.o. G108P2002 female at [redacted]w[redacted]d with an Estimated Date of Delivery: 10/31/21 being seen today for ongoing management of a low-risk pregnancy.  ?Today she reports  that she previously agreed to have a repeat CS, but she "feels forced to have another CS." Would like more information on the reason why she needs a CS before she signs the CS consent form . Contractions: Not present. Vag. Bleeding: None.  Movement: Present. denies leaking of fluid. ?Review of Systems:   ?Pertinent items are noted in HPI ?Denies abnormal vaginal discharge w/ itching/odor/irritation, headaches, visual changes, shortness of breath, chest pain, abdominal pain, severe nausea/vomiting, or problems with urination or bowel movements unless otherwise stated above. ?Pertinent History Reviewed:  ?Reviewed past medical,surgical, social, obstetrical and family history.  ?Reviewed problem list, medications and allergies. ?Physical Assessment:  ? ?Vitals:  ? 08/05/21 1053  ?BP: 130/68  ?Pulse: 99  ?Weight: 258 lb (117 kg)  ?Body mass index is 47.19 kg/m?. ?  ?     Physical Examination:  ? General appearance: Well appearing, and in no distress ? Mental status: Alert, oriented to person, place, and time ? Skin: Warm & dry ? Cardiovascular: Normal heart rate noted ? Respiratory: Normal respiratory effort, no distress ? Abdomen: Soft, gravid, nontender ? Pelvic: Cervical exam deferred        ? Extremities: Edema: Trace ? ?Fetal Status: Fetal Heart Rate (bpm): 138 Fundal Height: 38 cm Movement: Present   ? ?No results found for this or any previous visit (from the past 24 hour(s)).  ?Assessment & Plan:  ?1) Low-risk pregnancy G3P2002 at [redacted]w[redacted]d with an Estimated Date of Delivery: 10/31/21  ? ?2) Supervision of other normal pregnancy, antepartum ?- Anticipatory  guidance for 2 hr GTT - advised to fast after midnight without anything to eat or drink (except for water), will have fasting blood drawn, drink the glucola drink (flavor choices: orange or fruit punch), have a visit with a provider during the first hour of testing, wait in the lab waiting room to have blood drawn at 1 hour and then 2 hours after finishing glucola drink.  ? ?3) History of cesarean delivery, currently pregnant ?- Advised to see MD for clarification and concerns about repeat CS ?- Sign CS consent with MD at nv ? ?4) Obesity during pregnancy, antepartum ?- Taking bASA daily ? ?5) [redacted] weeks gestation of pregnancy  ?  ?Meds: No orders of the defined types were placed in this encounter. ? ?Labs/procedures today: none ? ?Plan:  Continue routine obstetrical care  ? ?Reviewed: Preterm labor symptoms and general obstetric precautions including but not limited to vaginal bleeding, contractions, leaking of fluid and fetal movement were reviewed in detail with the patient.  All questions were answered. Has home bp cuff. Check bp weekly, let us know if >140/90.  ? ?Follow-up: Return in about 2 weeks (around 08/19/2021) for Return OB 2hr GTT. ? ?No orders of the defined types were placed in this encounter. ? ?Raelyn Mora MSN, CNM ?08/05/2021 ?11:11 AM ?

## 2021-08-10 ENCOUNTER — Ambulatory Visit: Payer: Medicaid Other | Attending: Obstetrics and Gynecology

## 2021-08-10 ENCOUNTER — Encounter: Payer: Self-pay | Admitting: *Deleted

## 2021-08-10 ENCOUNTER — Other Ambulatory Visit: Payer: Self-pay | Admitting: *Deleted

## 2021-08-10 ENCOUNTER — Ambulatory Visit: Payer: Medicaid Other | Admitting: *Deleted

## 2021-08-10 VITALS — BP 110/75 | HR 98

## 2021-08-10 DIAGNOSIS — O99212 Obesity complicating pregnancy, second trimester: Secondary | ICD-10-CM

## 2021-08-10 DIAGNOSIS — E669 Obesity, unspecified: Secondary | ICD-10-CM

## 2021-08-10 DIAGNOSIS — O0990 Supervision of high risk pregnancy, unspecified, unspecified trimester: Secondary | ICD-10-CM | POA: Insufficient documentation

## 2021-08-10 DIAGNOSIS — Z362 Encounter for other antenatal screening follow-up: Secondary | ICD-10-CM | POA: Insufficient documentation

## 2021-08-10 DIAGNOSIS — Z3A28 28 weeks gestation of pregnancy: Secondary | ICD-10-CM | POA: Diagnosis not present

## 2021-08-10 DIAGNOSIS — O0933 Supervision of pregnancy with insufficient antenatal care, third trimester: Secondary | ICD-10-CM

## 2021-08-10 DIAGNOSIS — O99213 Obesity complicating pregnancy, third trimester: Secondary | ICD-10-CM

## 2021-08-10 DIAGNOSIS — O34219 Maternal care for unspecified type scar from previous cesarean delivery: Secondary | ICD-10-CM

## 2021-08-19 ENCOUNTER — Telehealth: Payer: Self-pay | Admitting: Emergency Medicine

## 2021-08-19 NOTE — Telephone Encounter (Signed)
Breast pump order request faxed with confirmation. ?

## 2021-08-23 ENCOUNTER — Encounter: Payer: Medicaid Other | Admitting: Obstetrics

## 2021-08-23 ENCOUNTER — Other Ambulatory Visit: Payer: Medicaid Other

## 2021-09-03 ENCOUNTER — Other Ambulatory Visit: Payer: Medicaid Other

## 2021-09-03 DIAGNOSIS — Z3480 Encounter for supervision of other normal pregnancy, unspecified trimester: Secondary | ICD-10-CM

## 2021-09-04 LAB — CBC
Hematocrit: 32.8 % — ABNORMAL LOW (ref 34.0–46.6)
Hemoglobin: 10.5 g/dL — ABNORMAL LOW (ref 11.1–15.9)
MCH: 25.5 pg — ABNORMAL LOW (ref 26.6–33.0)
MCHC: 32 g/dL (ref 31.5–35.7)
MCV: 80 fL (ref 79–97)
Platelets: 295 10*3/uL (ref 150–450)
RBC: 4.11 x10E6/uL (ref 3.77–5.28)
RDW: 15.2 % (ref 11.7–15.4)
WBC: 11.5 10*3/uL — ABNORMAL HIGH (ref 3.4–10.8)

## 2021-09-04 LAB — HIV ANTIBODY (ROUTINE TESTING W REFLEX): HIV Screen 4th Generation wRfx: NONREACTIVE

## 2021-09-04 LAB — RPR: RPR Ser Ql: NONREACTIVE

## 2021-09-04 LAB — GLUCOSE TOLERANCE, 2 HOURS W/ 1HR
Glucose, 1 hour: 180 mg/dL — ABNORMAL HIGH (ref 70–179)
Glucose, 2 hour: 185 mg/dL — ABNORMAL HIGH (ref 70–152)
Glucose, Fasting: 92 mg/dL — ABNORMAL HIGH (ref 70–91)

## 2021-09-07 ENCOUNTER — Other Ambulatory Visit (INDEPENDENT_AMBULATORY_CARE_PROVIDER_SITE_OTHER): Payer: Medicaid Other | Admitting: Obstetrics and Gynecology

## 2021-09-07 ENCOUNTER — Other Ambulatory Visit: Payer: Self-pay | Admitting: Obstetrics and Gynecology

## 2021-09-07 DIAGNOSIS — O24410 Gestational diabetes mellitus in pregnancy, diet controlled: Secondary | ICD-10-CM | POA: Insufficient documentation

## 2021-09-07 DIAGNOSIS — D509 Iron deficiency anemia, unspecified: Secondary | ICD-10-CM

## 2021-09-07 DIAGNOSIS — O99013 Anemia complicating pregnancy, third trimester: Secondary | ICD-10-CM

## 2021-09-07 MED ORDER — ACCU-CHEK SOFTCLIX LANCETS MISC: 12 refills | Status: DC

## 2021-09-07 MED ORDER — ACCU-CHEK GUIDE W/DEVICE KIT: 1.0000 | PACK | Freq: Four times a day (QID) | 0 refills | Status: DC

## 2021-09-07 MED ORDER — ASCORBIC ACID 500 MG PO TABS: 500.0000 mg | ORAL_TABLET | ORAL | 2 refills | Status: DC

## 2021-09-07 MED ORDER — FERROUS SULFATE 325 (65 FE) MG PO TBEC: 325.0000 mg | DELAYED_RELEASE_TABLET | ORAL | 2 refills | Status: DC

## 2021-09-10 ENCOUNTER — Encounter: Payer: Self-pay | Admitting: Obstetrics

## 2021-09-10 ENCOUNTER — Ambulatory Visit (INDEPENDENT_AMBULATORY_CARE_PROVIDER_SITE_OTHER): Payer: Medicaid Other | Admitting: Obstetrics

## 2021-09-10 VITALS — BP 111/65 | HR 85 | Wt 259.0 lb

## 2021-09-10 DIAGNOSIS — M549 Dorsalgia, unspecified: Secondary | ICD-10-CM

## 2021-09-10 DIAGNOSIS — Z23 Encounter for immunization: Secondary | ICD-10-CM

## 2021-09-10 DIAGNOSIS — O24410 Gestational diabetes mellitus in pregnancy, diet controlled: Secondary | ICD-10-CM

## 2021-09-10 DIAGNOSIS — O99013 Anemia complicating pregnancy, third trimester: Secondary | ICD-10-CM

## 2021-09-10 DIAGNOSIS — O34219 Maternal care for unspecified type scar from previous cesarean delivery: Secondary | ICD-10-CM

## 2021-09-10 DIAGNOSIS — D509 Iron deficiency anemia, unspecified: Secondary | ICD-10-CM

## 2021-09-10 DIAGNOSIS — O0993 Supervision of high risk pregnancy, unspecified, third trimester: Secondary | ICD-10-CM | POA: Diagnosis not present

## 2021-09-10 DIAGNOSIS — O0990 Supervision of high risk pregnancy, unspecified, unspecified trimester: Secondary | ICD-10-CM

## 2021-09-10 DIAGNOSIS — O99210 Obesity complicating pregnancy, unspecified trimester: Secondary | ICD-10-CM

## 2021-09-10 MED ORDER — COMFORT FIT MATERNITY SUPP SM MISC: 0 refills | Status: DC

## 2021-09-10 NOTE — Progress Notes (Signed)
Subjective:  Ruth Perry is a 31 y.o. G3P2002 at [redacted]w[redacted]d being seen today for ongoing prenatal care.  She is currently monitored for the following issues for this high-risk pregnancy and has Bleeding in early pregnancy; Trichomonal vaginitis during pregnancy; Supervision of other normal pregnancy, antepartum; History of 2 cesarean sections; Obesity during pregnancy, antepartum; Chlamydia infection affecting pregnancy; Encounter for induction of labor; Morbid obesity ([place]); Asthma; COVID-19 virus infection; Supervision of high risk pregnancy, antepartum; BMI 38.0-38.9,adult; Late prenatal care complicating pregnancy in second trimester; and Diet controlled gestational diabetes mellitus (GDM) in third trimester on their problem list.  Patient reports backache.  Contractions: Not present. Vag. Bleeding: None.  Movement: Present. Denies leaking of fluid.   The following portions of the patient's history were reviewed and updated as appropriate: allergies, current medications, past family history, past medical history, past social history, past surgical history and problem list. Problem list updated.  Objective:   Vitals:   09/10/21 1137  BP: 111/65  Pulse: 85  Weight: 259 lb (117.5 kg)    Fetal Status: Fetal Heart Rate (bpm): 130   Movement: Present     General:  Alert, oriented and cooperative. Patient is in no acute distress.  Skin: Skin is warm and dry. No rash noted.   Cardiovascular: Normal heart rate noted  Respiratory: Normal respiratory effort, no problems with respiration noted  Abdomen: Soft, gravid, appropriate for gestational age. Pain/Pressure: Absent     Pelvic:  Cervical exam deferred        Extremities: Normal range of motion.  Edema: Trace  Mental Status: Normal mood and affect. Normal behavior. Normal judgment and thought content.   Urinalysis:      Assessment and Plan:  Pregnancy: G3P2002 at [redacted]w[redacted]d  1. Supervision of high risk pregnancy, antepartum Rx: - Tdap vaccine  greater than or equal to 7yo IM  2. History of cesarean delivery x 2,  currently pregnant - desires BTL  3. Diet controlled gestational diabetes mellitus (GDM) in third trimester - has not had Diabetic Teaching and has not picked up supplies for glucose testing  4. Maternal iron deficiency anemia complicating pregnancy in third trimester - taking iron  5. Obesity during pregnancy, antepartum  6. Backache symptom Rx: - Elastic Bandages & Supports (COMFORT FIT MATERNITY SUPP SM) MISC; Wear as directed.  Dispense: 1 each; Refill: 0   Preterm labor symptoms and general obstetric precautions including but not limited to vaginal bleeding, contractions, leaking of fluid and fetal movement were reviewed in detail with the patient. Please refer to After Visit Summary for other counseling recommendations.   Return in about 2 weeks (around 09/24/2021) for [facility].    Shelly Bombard, MD 09/10/2021 1:16 PM

## 2021-09-10 NOTE — Progress Notes (Signed)
Patient presents for ROB. Patient has no concerns today. Patient has not started checking blood sugars yet. Has not picked up supplies yet.

## 2021-09-15 ENCOUNTER — Encounter: Payer: Medicaid Other | Attending: Obstetrics and Gynecology | Admitting: Registered"

## 2021-09-21 ENCOUNTER — Ambulatory Visit: Payer: Medicaid Other

## 2021-10-01 ENCOUNTER — Ambulatory Visit (INDEPENDENT_AMBULATORY_CARE_PROVIDER_SITE_OTHER): Payer: Medicaid Other | Admitting: Obstetrics and Gynecology

## 2021-10-01 ENCOUNTER — Encounter: Payer: Self-pay | Admitting: Obstetrics and Gynecology

## 2021-10-01 VITALS — BP 97/63 | HR 88 | Wt 256.0 lb

## 2021-10-01 DIAGNOSIS — O0990 Supervision of high risk pregnancy, unspecified, unspecified trimester: Secondary | ICD-10-CM

## 2021-10-01 DIAGNOSIS — Z98891 History of uterine scar from previous surgery: Secondary | ICD-10-CM

## 2021-10-01 DIAGNOSIS — Z3009 Encounter for other general counseling and advice on contraception: Secondary | ICD-10-CM

## 2021-10-01 DIAGNOSIS — O24410 Gestational diabetes mellitus in pregnancy, diet controlled: Secondary | ICD-10-CM

## 2021-10-01 NOTE — Patient Instructions (Signed)

## 2021-10-01 NOTE — Progress Notes (Signed)
Subjective:  Ruth Perry is a 31 y.o. G3P2002 at [redacted]w[redacted]d being seen today for ongoing prenatal care.  She is currently monitored for the following issues for this high-risk pregnancy and has Supervision of other normal pregnancy, antepartum; History of 2 cesarean sections; Obesity during pregnancy, antepartum; Morbid obesity (H[facility]); Asthma; Supervision of high risk pregnancy, antepartum; BMI 38.0-38.9,adult; Late prenatal care complicating pregnancy in second trimester; Diet controlled gestational diabetes mellitus (GDM) in third trimester; and Unwanted fertility on their problem list.  Patient reports general discomforts of pregnancy.  Contractions: Not present. Vag. Bleeding: None.  Movement: Present. Denies leaking of fluid.   The following portions of the patient's history were reviewed and updated as appropriate: allergies, current medications, past family history, past medical history, past social history, past surgical history and problem list. Problem list updated.  Objective:   Vitals:   10/01/21 1121  BP: 97/63  Pulse: 88  Weight: 256 lb (116.1 kg)    Fetal Status: Fetal Heart Rate (bpm): 130   Movement: Present     General:  Alert, oriented and cooperative. Patient is in no acute distress.  Skin: Skin is warm and dry. No rash noted.   Cardiovascular: Normal heart rate noted  Respiratory: Normal respiratory effort, no problems with respiration noted  Abdomen: Soft, gravid, appropriate for gestational age. Pain/Pressure: Present     Pelvic:  Cervical exam deferred        Extremities: Normal range of motion.     Mental Status: Normal mood and affect. Normal behavior. Normal judgment and thought content.   Urinalysis:      Assessment and Plan:  Pregnancy: G3P2002 at 103w5d  1. Supervision of high risk pregnancy, antepartum Stable Cultures next visit  2. History of 2 cesarean sections For repeat  Schedule at next OB visit  3. Diet controlled gestational diabetes mellitus  (GDM) in third trimester Reports CBG's in goal range but did not bring in readings Pt instructed to bring in readings at next visit Missed growth scan will reschedule  4. Unwanted fertility BTL papers signed  Preterm labor symptoms and general obstetric precautions including but not limited to vaginal bleeding, contractions, leaking of fluid and fetal movement were reviewed in detail with the patient. Please refer to After Visit Summary for other counseling recommendations.  Return in about 1 week (around 10/08/2021) for OB visit, face to face, MD only.   Hermina Staggers, MD

## 2021-10-05 ENCOUNTER — Encounter (HOSPITAL_COMMUNITY): Payer: Self-pay | Admitting: Obstetrics & Gynecology

## 2021-10-05 ENCOUNTER — Inpatient Hospital Stay (HOSPITAL_COMMUNITY)
Admission: AD | Admit: 2021-10-05 | Discharge: 2021-10-05 | Disposition: A | Discharge status: Home / Self Care | Payer: Medicaid Other | Attending: Obstetrics & Gynecology | Admitting: Obstetrics & Gynecology

## 2021-10-05 ENCOUNTER — Other Ambulatory Visit: Payer: Self-pay

## 2021-10-05 DIAGNOSIS — O4703 False labor before 37 completed weeks of gestation, third trimester: Secondary | ICD-10-CM | POA: Diagnosis not present

## 2021-10-05 DIAGNOSIS — O479 False labor, unspecified: Secondary | ICD-10-CM | POA: Diagnosis not present

## 2021-10-05 DIAGNOSIS — Z3A36 36 weeks gestation of pregnancy: Secondary | ICD-10-CM | POA: Diagnosis not present

## 2021-10-05 NOTE — MAU Provider Note (Signed)
  S: Ms. Ruth Perry is a 31 y.o. G3P2002 at [redacted]w[redacted]d  who presents to MAU today for labor evaluation.   She is scheduled for repeat c/section at 39 weeks but reporting contracting once in 30 minutes.   Cervical exam by RN:  Dilation: 2.5 Effacement (%): Thick Station: Ballotable Exam by:: Dorathy Daft, CNM  Fetal Monitoring: Baseline: 130 Variability: mod Accelerations: present Decelerations: absent Contractions: irregular   MDM Discussed patient with RN. NST reviewed. With Dr. Macon Large, who agrees safe for discharge.  Patient BP was initially elevated in MAU but following readings were all normal-low normal.   A: SIUP at [redacted]w[redacted]d  False labor  P: Discharge home Patient to follow-up with  [facility]; Dr. Annia Friendly sent message to [facility] to schedule patient this week  Patient may return to MAU as needed or when in labor   Marylene Land, CNM 10/05/2021 8:20 PM   Ruth Perry 10/05/2021, 8:20 PM

## 2021-10-05 NOTE — MAU Note (Signed)
Ruth Perry is a 31 y.o. at [redacted]w[redacted]d here in MAU reporting: had contractions last night and then they stopped. They started back today and have been every 20-30 minutes. No bleeding or LOF. +FM  Onset of complaint: yesterday  Pain score: 8/10  FHT:+FM  Lab orders placed from triage: none

## 2021-10-12 ENCOUNTER — Other Ambulatory Visit (HOSPITAL_COMMUNITY)
Admission: RE | Admit: 2021-10-12 | Discharge: 2021-10-12 | Disposition: A | Discharge status: Home / Self Care | Payer: Medicaid Other | Source: Ambulatory Visit | Attending: Obstetrics and Gynecology | Admitting: Obstetrics and Gynecology

## 2021-10-12 ENCOUNTER — Ambulatory Visit (INDEPENDENT_AMBULATORY_CARE_PROVIDER_SITE_OTHER): Payer: Medicaid Other | Admitting: Student

## 2021-10-12 VITALS — BP 106/68 | HR 80 | Wt 256.6 lb

## 2021-10-12 DIAGNOSIS — O24410 Gestational diabetes mellitus in pregnancy, diet controlled: Secondary | ICD-10-CM

## 2021-10-12 DIAGNOSIS — O0990 Supervision of high risk pregnancy, unspecified, unspecified trimester: Secondary | ICD-10-CM

## 2021-10-12 DIAGNOSIS — Z3A37 37 weeks gestation of pregnancy: Secondary | ICD-10-CM

## 2021-10-12 DIAGNOSIS — O99210 Obesity complicating pregnancy, unspecified trimester: Secondary | ICD-10-CM

## 2021-10-13 ENCOUNTER — Ambulatory Visit: Payer: Medicaid Other | Admitting: *Deleted

## 2021-10-13 ENCOUNTER — Encounter (HOSPITAL_COMMUNITY): Payer: Self-pay

## 2021-10-13 ENCOUNTER — Encounter: Payer: Self-pay | Admitting: *Deleted

## 2021-10-13 ENCOUNTER — Ambulatory Visit: Payer: Medicaid Other | Attending: Obstetrics and Gynecology

## 2021-10-13 VITALS — BP 118/52 | HR 81

## 2021-10-13 DIAGNOSIS — E669 Obesity, unspecified: Secondary | ICD-10-CM

## 2021-10-13 DIAGNOSIS — O24410 Gestational diabetes mellitus in pregnancy, diet controlled: Secondary | ICD-10-CM | POA: Diagnosis not present

## 2021-10-13 DIAGNOSIS — Z3A37 37 weeks gestation of pregnancy: Secondary | ICD-10-CM

## 2021-10-13 DIAGNOSIS — O0990 Supervision of high risk pregnancy, unspecified, unspecified trimester: Secondary | ICD-10-CM | POA: Insufficient documentation

## 2021-10-13 DIAGNOSIS — O99213 Obesity complicating pregnancy, third trimester: Secondary | ICD-10-CM | POA: Insufficient documentation

## 2021-10-13 DIAGNOSIS — O34219 Maternal care for unspecified type scar from previous cesarean delivery: Secondary | ICD-10-CM | POA: Diagnosis not present

## 2021-10-13 DIAGNOSIS — O0933 Supervision of pregnancy with insufficient antenatal care, third trimester: Secondary | ICD-10-CM | POA: Insufficient documentation

## 2021-10-13 LAB — CERVICOVAGINAL ANCILLARY ONLY
Chlamydia: NEGATIVE
Comment: NEGATIVE
Comment: NORMAL
Neisseria Gonorrhea: NEGATIVE

## 2021-10-13 NOTE — Patient Instructions (Signed)
Ruth Perry  10/13/2021   Your procedure is scheduled on:  10/24/2021  Arrive at 0730 at Entrance C on [employer] at [facility]  and [employer]. You are invited to use the FREE valet parking or use the Visitor's parking deck.  Pick up the phone at the desk and dial [phone].  Call this number if you have problems the morning of surgery: [phone]  Remember:   Do not eat food:(After Midnight) Desps de medianoche.  Do not drink clear liquids: (After Midnight) Desps de medianoche.  Take these medicines the morning of surgery with A SIP OF WATER:  none   Do not wear jewelry, make-up or nail polish.  Do not wear lotions, powders, or perfumes. Do not wear deodorant.  Do not shave 48 hours prior to surgery.  Do not bring valuables to the hospital.  [facility] is not   responsible for any belongings or valuables brought to the hospital.  Contacts, dentures or bridgework may not be worn into surgery.  Leave suitcase in the car. After surgery it may be brought to your room.  For patients admitted to the hospital, checkout time is 11:00 AM the day of              discharge.      Please read over the following fact sheets that you were given:     Preparing for Surgery

## 2021-10-16 LAB — CULTURE, BETA STREP (GROUP B ONLY): Strep Gp B Culture: NEGATIVE

## 2021-10-19 ENCOUNTER — Encounter (HOSPITAL_COMMUNITY)
Admission: AD | Disposition: A | Discharge status: Home / Self Care | Payer: Self-pay | Source: Home / Self Care | Attending: Obstetrics & Gynecology

## 2021-10-19 ENCOUNTER — Inpatient Hospital Stay (HOSPITAL_COMMUNITY)
Admission: RE | Admit: 2021-10-19 | Payer: Medicaid Other | Source: Ambulatory Visit | Admitting: Obstetrics and Gynecology

## 2021-10-19 ENCOUNTER — Encounter (HOSPITAL_COMMUNITY): Payer: Self-pay | Admitting: Obstetrics & Gynecology

## 2021-10-19 ENCOUNTER — Inpatient Hospital Stay (HOSPITAL_COMMUNITY): Payer: Medicaid Other | Admitting: Anesthesiology

## 2021-10-19 ENCOUNTER — Other Ambulatory Visit: Payer: Self-pay

## 2021-10-19 ENCOUNTER — Inpatient Hospital Stay (HOSPITAL_COMMUNITY)
Admission: AD | Admit: 2021-10-19 | Discharge: 2021-10-21 | DRG: 785 | Disposition: A | Discharge status: Home / Self Care | Payer: Medicaid Other | Attending: Obstetrics & Gynecology | Admitting: Obstetrics & Gynecology

## 2021-10-19 DIAGNOSIS — O0990 Supervision of high risk pregnancy, unspecified, unspecified trimester: Principal | ICD-10-CM

## 2021-10-19 DIAGNOSIS — J45909 Unspecified asthma, uncomplicated: Secondary | ICD-10-CM | POA: Diagnosis not present

## 2021-10-19 DIAGNOSIS — Z98891 History of uterine scar from previous surgery: Secondary | ICD-10-CM

## 2021-10-19 DIAGNOSIS — Z3490 Encounter for supervision of normal pregnancy, unspecified, unspecified trimester: Secondary | ICD-10-CM

## 2021-10-19 DIAGNOSIS — O24429 Gestational diabetes mellitus in childbirth, unspecified control: Secondary | ICD-10-CM | POA: Diagnosis not present

## 2021-10-19 DIAGNOSIS — O9952 Diseases of the respiratory system complicating childbirth: Secondary | ICD-10-CM

## 2021-10-19 DIAGNOSIS — Z8616 Personal history of COVID-19: Secondary | ICD-10-CM | POA: Diagnosis not present

## 2021-10-19 DIAGNOSIS — Z302 Encounter for sterilization: Secondary | ICD-10-CM

## 2021-10-19 DIAGNOSIS — Z3A38 38 weeks gestation of pregnancy: Secondary | ICD-10-CM

## 2021-10-19 DIAGNOSIS — O99214 Obesity complicating childbirth: Secondary | ICD-10-CM | POA: Diagnosis present

## 2021-10-19 DIAGNOSIS — O26893 Other specified pregnancy related conditions, third trimester: Secondary | ICD-10-CM | POA: Diagnosis present

## 2021-10-19 DIAGNOSIS — Z3009 Encounter for other general counseling and advice on contraception: Secondary | ICD-10-CM | POA: Diagnosis present

## 2021-10-19 DIAGNOSIS — O24420 Gestational diabetes mellitus in childbirth, diet controlled: Principal | ICD-10-CM | POA: Diagnosis present

## 2021-10-19 DIAGNOSIS — O0932 Supervision of pregnancy with insufficient antenatal care, second trimester: Secondary | ICD-10-CM

## 2021-10-19 DIAGNOSIS — O24410 Gestational diabetes mellitus in pregnancy, diet controlled: Secondary | ICD-10-CM | POA: Diagnosis present

## 2021-10-19 DIAGNOSIS — O34211 Maternal care for low transverse scar from previous cesarean delivery: Secondary | ICD-10-CM

## 2021-10-19 LAB — TYPE AND SCREEN
ABO/RH(D): O POS
Antibody Screen: NEGATIVE

## 2021-10-19 LAB — CBC
HCT: 34 % — ABNORMAL LOW (ref 36.0–46.0)
Hemoglobin: 11 g/dL — ABNORMAL LOW (ref 12.0–15.0)
MCH: 24.4 pg — ABNORMAL LOW (ref 26.0–34.0)
MCHC: 32.4 g/dL (ref 30.0–36.0)
MCV: 75.6 fL — ABNORMAL LOW (ref 80.0–100.0)
Platelets: 269 10*3/uL (ref 150–400)
RBC: 4.5 MIL/uL (ref 3.87–5.11)
RDW: 17.5 % — ABNORMAL HIGH (ref 11.5–15.5)
WBC: 13.8 10*3/uL — ABNORMAL HIGH (ref 4.0–10.5)
nRBC: 0 % (ref 0.0–0.2)

## 2021-10-19 LAB — GLUCOSE, CAPILLARY: Glucose-Capillary: 104 mg/dL — ABNORMAL HIGH (ref 70–99)

## 2021-10-19 SURGERY — Surgical Case
Anesthesia: Regional

## 2021-10-19 SURGERY — Surgical Case
Anesthesia: Spinal

## 2021-10-19 MED ORDER — SCOPOLAMINE 1 MG/3DAYS TD PT72: 1.0000 | MEDICATED_PATCH | Freq: Once | TRANSDERMAL | Status: DC

## 2021-10-19 MED ORDER — OXYCODONE-ACETAMINOPHEN 5-325 MG PO TABS: 2.0000 | ORAL_TABLET | ORAL | Status: DC | PRN

## 2021-10-19 MED ORDER — OXYCODONE HCL 5 MG PO TABS: 5.0000 mg | ORAL_TABLET | Freq: Once | ORAL | Status: DC | PRN

## 2021-10-19 MED ORDER — FENTANYL CITRATE (PF) 100 MCG/2ML IJ SOLN
INTRAMUSCULAR | Status: DC | PRN
Start: 2021-10-19 — End: 2021-10-19
  Administered 2021-10-19: 15 ug via INTRATHECAL

## 2021-10-19 MED ORDER — MENTHOL 3 MG MT LOZG: 1.0000 | LOZENGE | OROMUCOSAL | Status: DC | PRN

## 2021-10-19 MED ORDER — MEASLES, MUMPS & RUBELLA VAC IJ SOLR: 0.5000 mL | Freq: Once | INTRAMUSCULAR | Status: DC

## 2021-10-19 MED ORDER — CEFAZOLIN SODIUM-DEXTROSE 2-4 GM/100ML-% IV SOLN
2.0000 g | INTRAVENOUS | Status: AC
  Administered 2021-10-19: 2 g via INTRAVENOUS
  Filled 2021-10-19: qty 100

## 2021-10-19 MED ORDER — SCOPOLAMINE 1 MG/3DAYS TD PT72
MEDICATED_PATCH | TRANSDERMAL | Status: DC | PRN
  Administered 2021-10-19: 1 via TRANSDERMAL

## 2021-10-19 MED ORDER — OXYTOCIN-SODIUM CHLORIDE 30-0.9 UT/500ML-% IV SOLN: 2.5000 [IU]/h | INTRAVENOUS | Status: AC

## 2021-10-19 MED ORDER — ENOXAPARIN SODIUM 60 MG/0.6ML IJ SOSY
60.0000 mg | PREFILLED_SYRINGE | INTRAMUSCULAR | Status: DC
  Administered 2021-10-20: 60 mg via SUBCUTANEOUS
  Filled 2021-10-19: qty 0.6

## 2021-10-19 MED ORDER — ENOXAPARIN SODIUM 30 MG/0.3ML IJ SOSY: 30.0000 mg | PREFILLED_SYRINGE | INTRAMUSCULAR | Status: DC

## 2021-10-19 MED ORDER — METHYLERGONOVINE MALEATE 0.2 MG/ML IJ SOLN
INTRAMUSCULAR | Status: AC
  Filled 2021-10-19: qty 1

## 2021-10-19 MED ORDER — ONDANSETRON HCL 4 MG/2ML IJ SOLN: 4.0000 mg | Freq: Once | INTRAMUSCULAR | Status: DC | PRN

## 2021-10-19 MED ORDER — SODIUM CHLORIDE 0.9 % IV SOLN
500.0000 mg | Freq: Once | INTRAVENOUS | Status: DC
  Filled 2021-10-19: qty 5

## 2021-10-19 MED ORDER — SODIUM CHLORIDE 0.9 % IV SOLN
INTRAVENOUS | Status: DC | PRN
  Administered 2021-10-19: 500 mg via INTRAVENOUS

## 2021-10-19 MED ORDER — COCONUT OIL OIL: 1.0000 | TOPICAL_OIL | Status: DC | PRN

## 2021-10-19 MED ORDER — OXYTOCIN-SODIUM CHLORIDE 30-0.9 UT/500ML-% IV SOLN
INTRAVENOUS | Status: DC | PRN
  Administered 2021-10-19 (×2): 500 mL via INTRAVENOUS

## 2021-10-19 MED ORDER — METOCLOPRAMIDE HCL 5 MG/ML IJ SOLN
INTRAMUSCULAR | Status: AC
  Filled 2021-10-19: qty 2

## 2021-10-19 MED ORDER — DIPHENHYDRAMINE HCL 50 MG/ML IJ SOLN: 12.5000 mg | INTRAMUSCULAR | Status: DC | PRN

## 2021-10-19 MED ORDER — SIMETHICONE 80 MG PO CHEW
80.0000 mg | CHEWABLE_TABLET | Freq: Three times a day (TID) | ORAL | Status: DC
Start: 2021-10-20 — End: 2021-10-21
  Administered 2021-10-20 – 2021-10-21 (×4): 80 mg via ORAL
  Filled 2021-10-19 (×4): qty 1

## 2021-10-19 MED ORDER — OXYTOCIN-SODIUM CHLORIDE 30-0.9 UT/500ML-% IV SOLN
INTRAVENOUS | Status: AC
  Filled 2021-10-19: qty 500

## 2021-10-19 MED ORDER — BUPIVACAINE IN DEXTROSE 0.75-8.25 % IT SOLN
INTRATHECAL | Status: DC | PRN
  Administered 2021-10-19: 12.75 mg via INTRATHECAL

## 2021-10-19 MED ORDER — NALOXONE HCL 4 MG/10ML IJ SOLN: 1.0000 ug/kg/h | INTRAVENOUS | Status: DC | PRN

## 2021-10-19 MED ORDER — HYDROMORPHONE HCL 1 MG/ML IJ SOLN: 0.2500 mg | INTRAMUSCULAR | Status: DC | PRN

## 2021-10-19 MED ORDER — MORPHINE SULFATE (PF) 0.5 MG/ML IJ SOLN
INTRAMUSCULAR | Status: AC
  Filled 2021-10-19: qty 10

## 2021-10-19 MED ORDER — ONDANSETRON HCL 4 MG/2ML IJ SOLN
INTRAMUSCULAR | Status: AC
  Filled 2021-10-19: qty 2

## 2021-10-19 MED ORDER — DIPHENHYDRAMINE HCL 25 MG PO CAPS: 25.0000 mg | ORAL_CAPSULE | ORAL | Status: DC | PRN

## 2021-10-19 MED ORDER — SCOPOLAMINE 1 MG/3DAYS TD PT72
MEDICATED_PATCH | TRANSDERMAL | Status: AC
  Filled 2021-10-19: qty 1

## 2021-10-19 MED ORDER — KETOROLAC TROMETHAMINE 30 MG/ML IJ SOLN
30.0000 mg | Freq: Four times a day (QID) | INTRAMUSCULAR | Status: AC
  Administered 2021-10-20 (×4): 30 mg via INTRAVENOUS
  Filled 2021-10-19 (×4): qty 1

## 2021-10-19 MED ORDER — METOCLOPRAMIDE HCL 5 MG/ML IJ SOLN
INTRAMUSCULAR | Status: DC | PRN
  Administered 2021-10-19: 10 mg via INTRAVENOUS

## 2021-10-19 MED ORDER — POVIDONE-IODINE 10 % EX SWAB
2.0000 | Freq: Once | CUTANEOUS | Status: AC
  Administered 2021-10-19: 2 via TOPICAL

## 2021-10-19 MED ORDER — METHYLERGONOVINE MALEATE 0.2 MG/ML IJ SOLN
0.2000 mg | Freq: Once | INTRAMUSCULAR | Status: AC
Start: 2021-10-19 — End: 2021-10-19
  Administered 2021-10-19: 0.2 mg via INTRAMUSCULAR

## 2021-10-19 MED ORDER — OXYCODONE HCL 5 MG/5ML PO SOLN: 5.0000 mg | Freq: Once | ORAL | Status: DC | PRN

## 2021-10-19 MED ORDER — KETOROLAC TROMETHAMINE 30 MG/ML IJ SOLN
INTRAMUSCULAR | Status: AC
  Filled 2021-10-19: qty 1

## 2021-10-19 MED ORDER — WITCH HAZEL-GLYCERIN EX PADS: 1.0000 | MEDICATED_PAD | CUTANEOUS | Status: DC | PRN

## 2021-10-19 MED ORDER — SODIUM CHLORIDE 0.9% FLUSH: 3.0000 mL | INTRAVENOUS | Status: DC | PRN

## 2021-10-19 MED ORDER — FENTANYL CITRATE (PF) 100 MCG/2ML IJ SOLN
INTRAMUSCULAR | Status: AC
  Filled 2021-10-19: qty 2

## 2021-10-19 MED ORDER — ONDANSETRON HCL 4 MG/2ML IJ SOLN
INTRAMUSCULAR | Status: DC | PRN
  Administered 2021-10-19: 4 mg via INTRAVENOUS

## 2021-10-19 MED ORDER — ALBUTEROL SULFATE HFA 108 (90 BASE) MCG/ACT IN AERS: 2.0000 | INHALATION_SPRAY | Freq: Four times a day (QID) | RESPIRATORY_TRACT | Status: DC | PRN

## 2021-10-19 MED ORDER — STERILE WATER FOR IRRIGATION IR SOLN
Status: DC | PRN
  Administered 2021-10-19: 1

## 2021-10-19 MED ORDER — KETOROLAC TROMETHAMINE 30 MG/ML IJ SOLN: 30.0000 mg | Freq: Once | INTRAMUSCULAR | Status: DC | PRN

## 2021-10-19 MED ORDER — IBUPROFEN 600 MG PO TABS
600.0000 mg | ORAL_TABLET | Freq: Four times a day (QID) | ORAL | Status: DC
  Administered 2021-10-20 – 2021-10-21 (×3): 600 mg via ORAL
  Filled 2021-10-19 (×3): qty 1

## 2021-10-19 MED ORDER — MORPHINE SULFATE (PF) 0.5 MG/ML IJ SOLN
INTRAMUSCULAR | Status: DC | PRN
  Administered 2021-10-19: 150 ug via INTRATHECAL

## 2021-10-19 MED ORDER — PRENATAL MULTIVITAMIN CH
1.0000 | ORAL_TABLET | Freq: Every day | ORAL | Status: DC
  Administered 2021-10-20 – 2021-10-21 (×2): 1 via ORAL
  Filled 2021-10-19 (×2): qty 1

## 2021-10-19 MED ORDER — SIMETHICONE 80 MG PO CHEW: 80.0000 mg | CHEWABLE_TABLET | ORAL | Status: DC | PRN

## 2021-10-19 MED ORDER — ACETAMINOPHEN 10 MG/ML IV SOLN
INTRAVENOUS | Status: DC | PRN
  Administered 2021-10-19: 1000 mg via INTRAVENOUS

## 2021-10-19 MED ORDER — NALOXONE HCL 0.4 MG/ML IJ SOLN: 0.4000 mg | INTRAMUSCULAR | Status: DC | PRN

## 2021-10-19 MED ORDER — SOD CITRATE-CITRIC ACID 500-334 MG/5ML PO SOLN
30.0000 mL | Freq: Once | ORAL | Status: AC
  Administered 2021-10-19: 30 mL via ORAL
  Filled 2021-10-19: qty 30

## 2021-10-19 MED ORDER — FAMOTIDINE IN NACL 20-0.9 MG/50ML-% IV SOLN
20.0000 mg | Freq: Once | INTRAVENOUS | Status: AC
  Administered 2021-10-19: 20 mg via INTRAVENOUS
  Filled 2021-10-19: qty 50

## 2021-10-19 MED ORDER — ONDANSETRON HCL 4 MG/2ML IJ SOLN: 4.0000 mg | Freq: Three times a day (TID) | INTRAMUSCULAR | Status: DC | PRN

## 2021-10-19 MED ORDER — DIPHENHYDRAMINE HCL 25 MG PO CAPS: 25.0000 mg | ORAL_CAPSULE | Freq: Four times a day (QID) | ORAL | Status: DC | PRN

## 2021-10-19 MED ORDER — LACTATED RINGERS IV SOLN: INTRAVENOUS | Status: DC

## 2021-10-19 MED ORDER — PHENYLEPHRINE HCL (PRESSORS) 10 MG/ML IV SOLN
INTRAVENOUS | Status: DC | PRN
  Administered 2021-10-19: 80 ug via INTRAVENOUS

## 2021-10-19 MED ORDER — PHENYLEPHRINE 80 MCG/ML (10ML) SYRINGE FOR IV PUSH (FOR BLOOD PRESSURE SUPPORT)
PREFILLED_SYRINGE | INTRAVENOUS | Status: AC
  Filled 2021-10-19: qty 10

## 2021-10-19 MED ORDER — OXYCODONE HCL 5 MG PO TABS: 5.0000 mg | ORAL_TABLET | ORAL | Status: DC | PRN

## 2021-10-19 MED ORDER — ACETAMINOPHEN 325 MG PO TABS
650.0000 mg | ORAL_TABLET | ORAL | Status: DC | PRN
  Administered 2021-10-20: 650 mg via ORAL
  Filled 2021-10-19: qty 2

## 2021-10-19 MED ORDER — LACTATED RINGERS IV SOLN: INTRAVENOUS | Status: DC | PRN

## 2021-10-19 MED ORDER — TETANUS-DIPHTH-ACELL PERTUSSIS 5-2.5-18.5 LF-MCG/0.5 IM SUSY: 0.5000 mL | PREFILLED_SYRINGE | Freq: Once | INTRAMUSCULAR | Status: DC

## 2021-10-19 MED ORDER — DEXAMETHASONE SODIUM PHOSPHATE 4 MG/ML IJ SOLN
INTRAMUSCULAR | Status: AC
  Filled 2021-10-19: qty 2

## 2021-10-19 MED ORDER — SENNOSIDES-DOCUSATE SODIUM 8.6-50 MG PO TABS
2.0000 | ORAL_TABLET | Freq: Every day | ORAL | Status: DC
  Administered 2021-10-20 – 2021-10-21 (×2): 2 via ORAL
  Filled 2021-10-19 (×2): qty 2

## 2021-10-19 MED ORDER — MEPERIDINE HCL 25 MG/ML IJ SOLN: 6.2500 mg | INTRAMUSCULAR | Status: DC | PRN

## 2021-10-19 MED ORDER — PHENYLEPHRINE HCL-NACL 20-0.9 MG/250ML-% IV SOLN
INTRAVENOUS | Status: AC
  Filled 2021-10-19: qty 250

## 2021-10-19 MED ORDER — PHENYLEPHRINE HCL-NACL 20-0.9 MG/250ML-% IV SOLN
INTRAVENOUS | Status: DC | PRN
  Administered 2021-10-19: 60 ug/min via INTRAVENOUS

## 2021-10-19 MED ORDER — DEXAMETHASONE SODIUM PHOSPHATE 4 MG/ML IJ SOLN
INTRAMUSCULAR | Status: DC | PRN
  Administered 2021-10-19: 10 mg via INTRAVENOUS

## 2021-10-19 MED ORDER — DIBUCAINE (PERIANAL) 1 % EX OINT: 1.0000 | TOPICAL_OINTMENT | CUTANEOUS | Status: DC | PRN

## 2021-10-19 SURGICAL SUPPLY — 39 items
BENZOIN TINCTURE PRP APPL 2/3 (GAUZE/BANDAGES/DRESSINGS) ×2 IMPLANT
CHLORAPREP W/TINT 26 (MISCELLANEOUS) ×4 IMPLANT
CLAMP CORD UMBIL (MISCELLANEOUS) ×2 IMPLANT
CLOTH BEACON ORANGE TIMEOUT ST (SAFETY) ×2 IMPLANT
DERMABOND ADVANCED (GAUZE/BANDAGES/DRESSINGS) ×2
DERMABOND ADVANCED .7 DNX12 (GAUZE/BANDAGES/DRESSINGS) IMPLANT
DRSG OPSITE POSTOP 4X10 (GAUZE/BANDAGES/DRESSINGS) ×2 IMPLANT
ELECT REM PT RETURN 9FT ADLT (ELECTROSURGICAL) ×2
ELECTRODE REM PT RTRN 9FT ADLT (ELECTROSURGICAL) ×1 IMPLANT
EXTRACTOR VACUUM KIWI (MISCELLANEOUS) IMPLANT
GLOVE BIOGEL PI IND STRL 7.0 (GLOVE) ×3 IMPLANT
GLOVE BIOGEL PI INDICATOR 7.0 (GLOVE) ×3
GLOVE ECLIPSE 6.5 STRL STRAW (GLOVE) ×2 IMPLANT
GOWN STRL REUS W/TWL LRG LVL3 (GOWN DISPOSABLE) ×4 IMPLANT
HEMOSTAT ARISTA ABSORB 3G PWDR (HEMOSTASIS) ×1 IMPLANT
KIT ABG SYR 3ML LUER SLIP (SYRINGE) IMPLANT
NDL HYPO 25X5/8 SAFETYGLIDE (NEEDLE) IMPLANT
NEEDLE HYPO 25X5/8 SAFETYGLIDE (NEEDLE) IMPLANT
NS IRRIG 1000ML POUR BTL (IV SOLUTION) ×2 IMPLANT
PACK C SECTION WH (CUSTOM PROCEDURE TRAY) ×2 IMPLANT
PAD ABD 7.5X8 STRL (GAUZE/BANDAGES/DRESSINGS) ×2 IMPLANT
PAD OB MATERNITY 4.3X12.25 (PERSONAL CARE ITEMS) ×2 IMPLANT
PENCIL SMOKE EVAC W/HOLSTER (ELECTROSURGICAL) ×2 IMPLANT
RTRCTR C-SECT PINK 25CM LRG (MISCELLANEOUS) ×2 IMPLANT
STRIP CLOSURE SKIN 1/2X4 (GAUZE/BANDAGES/DRESSINGS) ×2 IMPLANT
SUT PLAIN 0 NONE (SUTURE) ×3 IMPLANT
SUT PLAIN 2 0 (SUTURE) ×1
SUT PLAIN 2 0 XLH (SUTURE) IMPLANT
SUT PLAIN ABS 2-0 CT1 27XMFL (SUTURE) IMPLANT
SUT VIC AB 0 CT1 27 (SUTURE) ×2
SUT VIC AB 0 CT1 27XBRD ANBCTR (SUTURE) ×2 IMPLANT
SUT VIC AB 0 CTX 36 (SUTURE) ×4
SUT VIC AB 0 CTX36XBRD ANBCTRL (SUTURE) ×3 IMPLANT
SUT VIC AB 2-0 CT1 27 (SUTURE) ×1
SUT VIC AB 2-0 CT1 TAPERPNT 27 (SUTURE) ×1 IMPLANT
SUT VIC AB 4-0 KS 27 (SUTURE) ×2 IMPLANT
TOWEL OR 17X24 6PK STRL BLUE (TOWEL DISPOSABLE) ×2 IMPLANT
TRAY FOLEY W/BAG SLVR 14FR LF (SET/KITS/TRAYS/PACK) IMPLANT
WATER STERILE IRR 1000ML POUR (IV SOLUTION) ×2 IMPLANT

## 2021-10-19 NOTE — Anesthesia Procedure Notes (Signed)
Spinal  Patient location during procedure: OB Start time: 10/19/2021 6:46 PM End time: 10/19/2021 6:49 PM Reason for block: surgical anesthesia Staffing Performed: anesthesiologist  Anesthesiologist: Trevor Iha, MD Performed by: Trevor Iha, MD Authorized by: Trevor Iha, MD   Preanesthetic Checklist Completed: patient identified, IV checked, risks and benefits discussed, surgical consent, monitors and equipment checked, pre-op evaluation and timeout performed Spinal Block Patient position: sitting Prep: DuraPrep and site prepped and draped Patient monitoring: heart rate, cardiac monitor, continuous pulse ox and blood pressure Approach: midline Location: L2-3 Injection technique: single-shot Needle Needle type: Pencan  Needle gauge: 24 G Needle length: 10 cm Needle insertion depth: 7 cm Assessment Sensory level: T4 Events: CSF return Additional Notes 1 Attempt (s). Pt tolerated procedure well.

## 2021-10-19 NOTE — MAU Note (Signed)
Ruth Perry is a 31 y.o. at [redacted]w[redacted]d here in MAU reporting: worsening contractions since last night. Since 10 this morning they have been stronger and are every 7-10 minutes. No bleeding or LOF. +FM  Onset of complaint: today  Pain score: 8/10  Vitals:   10/19/21 1704  BP: 115/70  Pulse: 93  Resp: 14  Temp: 98.2 F (36.8 C)  SpO2: 100%     FHT: +FM  Lab orders placed from triage: none

## 2021-10-19 NOTE — Anesthesia Preprocedure Evaluation (Signed)
Anesthesia Evaluation  Patient identified by MRN, date of birth, ID band Patient awake    Reviewed: Allergy & Precautions, NPO status , Patient's Chart, lab work & pertinent test results  Airway Mallampati: III  TM Distance: >3 FB Neck ROM: Full    Dental no notable dental hx. (+) Teeth Intact, Dental Advisory Given   Pulmonary asthma ,    Pulmonary exam normal breath sounds clear to auscultation       Cardiovascular Exercise Tolerance: Good Normal cardiovascular exam Rhythm:Regular Rate:Normal     Neuro/Psych negative neurological ROS     GI/Hepatic   Endo/Other  diabetes, Gestational  Renal/GU      Musculoskeletal   Abdominal (+) + obese (BMI 46.9),   Peds  Hematology Lab Results      Component                Value               Date                      WBC                      13.8 (H)            10/19/2021                HGB                      11.0 (L)            10/19/2021                HCT                      34.0 (L)            10/19/2021                MCV                      75.6 (L)            10/19/2021                PLT                      269                 10/19/2021              Anesthesia Other Findings   Reproductive/Obstetrics (+) Pregnancy                            Anesthesia Physical Anesthesia Plan  ASA: 3 and emergent  Anesthesia Plan: Spinal   Post-op Pain Management: Regional block*, Toradol IV (intra-op)* and Minimal or no pain anticipated   Induction: Intravenous  PONV Risk Score and Plan: 4 or greater and Treatment may vary due to age or medical condition and Ondansetron  Airway Management Planned: Natural Airway and Nasal Cannula  Additional Equipment: None  Intra-op Plan:   Post-operative Plan:   Informed Consent: I have reviewed the patients History and Physical, chart, labs and discussed the procedure including the risks, benefits  and alternatives for the proposed anesthesia with the patient or authorized representative who has indicated his/her understanding and acceptance.  Plan Discussed with:   Anesthesia Plan Comments: (38.2 Wk G3P w Asthma , GDM and BMI of 46.9 for Repeat C/S x 3 + TL)       Anesthesia Quick Evaluation

## 2021-10-19 NOTE — Anesthesia Postprocedure Evaluation (Signed)
Anesthesia Post Note  Patient: Ruth Perry  Procedure(s) Performed: CESAREAN SECTION WITH BILATERAL TUBAL LIGATION     Patient location during evaluation: Mother Baby Anesthesia Type: Spinal Level of consciousness: oriented and awake and alert Pain management: pain level controlled Vital Signs Assessment: post-procedure vital signs reviewed and stable Respiratory status: spontaneous breathing and respiratory function stable Cardiovascular status: blood pressure returned to baseline and stable Postop Assessment: no headache, no backache, no apparent nausea or vomiting and able to ambulate Anesthetic complications: no   No notable events documented.  Last Vitals:  Vitals:   10/19/21 2004 10/19/21 2015  BP: (!) 90/39 106/62  Pulse: 62 65  Resp: (!) 22 (!) 25  Temp: 36.6 C   SpO2: 100% 100%    Last Pain:  Vitals:   10/19/21 2015  TempSrc:   PainSc: 0-No pain   Pain Goal:    LLE Motor Response: No movement due to regional block (10/19/21 2015) LLE Sensation: No sensation (absent) (10/19/21 2015) RLE Motor Response: No movement due to regional block (10/19/21 2015) RLE Sensation: No sensation (absent) (10/19/21 2015)     Epidural/Spinal Function Cutaneous sensation: No Sensation (10/19/21 2015), Patient able to flex knees: No (10/19/21 2015), Patient able to lift hips off bed: No (10/19/21 2015), Back pain beyond tenderness at insertion site: No (10/19/21 2015), Progressively worsening motor and/or sensory loss: No (10/19/21 2015), Bowel and/or bladder incontinence post epidural: No (10/19/21 2015)  Trevor Iha

## 2021-10-19 NOTE — Op Note (Signed)
Ruth Perry PROCEDURE DATE: 10/19/2021  PREOPERATIVE DIAGNOSIS: Intrauterine pregnancy at  [redacted]w[redacted]d weeks gestation;  2 prior cesarean deliveries, labor  POSTOPERATIVE DIAGNOSIS: The same  PROCEDURE: Repeat Low Transverse Cesarean Section with bilateral salpingectomy.   SURGEON:  Dr. Candelaria Celeste  ASSISTANT: Dr Myna Hidalgo  An experienced assistant was required given the standard of surgical care given the complexity of the case.  This assistant was needed for exposure, dissection, suctioning, retraction, instrument exchange, assisting with delivery with administration of fundal pressure, and for overall help during the procedure.  INDICATIONS: Ruth Perry is a 31 y.o. G3P3003 at [redacted]w[redacted]d scheduled for cesarean section secondary to  2 prior vaginal deliveries, labor .  The risks of cesarean section discussed with the patient included but were not limited to: bleeding which may require transfusion or reoperation; infection which may require antibiotics; injury to bowel, bladder, ureters or other surrounding organs; injury to the fetus; need for additional procedures including hysterectomy in the event of a life-threatening hemorrhage; placental abnormalities wth subsequent pregnancies, incisional problems, thromboembolic phenomenon and other postoperative/anesthesia complications. The patient concurred with the proposed plan, giving informed written consent for the procedure.    FINDINGS:  Viable girl infant in vertex presentation OP.  Apgars 9 and 9, weight pending.  Clear amniotic fluid.  Intact placenta, three vessel cord.  Normal uterus, fallopian tubes and ovaries bilaterally.  ANESTHESIA:    Spinal INTRAVENOUS FLUIDS:700 ml ESTIMATED BLOOD LOSS: 450 ml URINE OUTPUT:  100 ml SPECIMENS: Placenta sent to L&D; bilateral tubes sent to pathology in a single specimen. COMPLICATIONS: None immediate  PROCEDURE IN DETAIL:  The patient received intravenous antibiotics and had sequential  compression devices applied to her lower extremities while in the preoperative area.  She was then taken to the operating room where spinal anesthesia was administered and was found to be adequate. She was then placed in a dorsal supine position with a leftward tilt, and prepped and draped in a sterile manner.  A foley catheter was placed into her bladder and attached to constant gravity, which drained clear fluid throughout.  After an adequate timeout was performed, a Pfannenstiel skin incision was made with scalpel and carried through to the underlying layer of fascia. The fascia was incised in the midline and this incision was extended bilaterally using the Mayo scissors. Kocher clamps were applied to the superior aspect of the fascial incision and the underlying rectus muscles were dissected off bluntly. A similar process was carried out on the inferior aspect of the facial incision. The rectus muscles were separated in the midline bluntly and the peritoneum was entered bluntly. An Alexis retractor was placed to aid in visualization of the uterus.  Attention was turned to the lower uterine segment where a transverse hysterotomy was made with a scalpel and extended bilaterally bluntly. The infant was successfully delivered, and cord was clamped and cut and infant was handed over to awaiting neonatology team. Uterine massage was then administered and the placenta delivered intact with three-vessel cord. The uterus was then cleared of clot and debris.  The hysterotomy was closed with 0 Vicryl in a running locked fashion. There was an extension noted in the left side of the hysterotomy. This was repaired with 0 Vicryl. Overall, excellent hemostasis was noted.   The right salpinx was identified and grasped with a babcock. Two kelly clamps were clamped across the salpinx, encompassing the majority of the salpinx. The salpinx was removed with mayos. 0 plain gut ties were tied below the  kellys and the kelly clamps  were removed. Hemostasis was noted. A similar process was done on the left salpinx to establish bilateral salpingectomy.  The abdomen and the pelvis were cleared of all clot and debris and the Jon Gills was removed. Hemostasis was confirmed on all surfaces.  The peritoneum was reapproximated using 2-0 vicryl running stitches. The fascia was then closed using 0 Vicryl in a running fashion. The subcutaneous layer was reapproximated with plain gut and the skin was closed with 4-0 vicryl. The patient tolerated the procedure well. Sponge, lap, instrument and needle counts were correct x 2. She was taken to the recovery room in stable condition.    Ruth Heritage, DO 10/19/2021 7:51 PM

## 2021-10-19 NOTE — H&P (Addendum)
Faculty Practice H&P  Ruth Perry is a 31 y.o. female G3P2002 with IUP at [redacted]w[redacted]d presenting for contractions that started having contractions last night, which increased this morning. Pregnancy complicated by A1 GDM, 2 prior cesarean sections.    Pt states she has been having contractions every 3 minutes. no vaginal bleeding, intact membranes, with normal fetal movement.     Prenatal Course Source of Care:  [facility]  with onset of care at 22weeks  Pregnancy complications or risks: Patient Active Problem List   Diagnosis Date Noted   Unwanted fertility 10/01/2021   Diet controlled gestational diabetes mellitus (GDM) in third trimester 09/07/2021   BMI 38.0-38.9,adult 07/08/2021   Late prenatal care complicating pregnancy in second trimester 07/08/2021   Supervision of high risk pregnancy, antepartum 07/01/2021   Morbid obesity (HCC)    Asthma    History of 2 cesarean sections 04/24/2019   Obesity during pregnancy, antepartum 04/24/2019   Supervision of other normal pregnancy, antepartum 03/26/2019   She desires bilateral tubal ligation for contraception.  She plans to breastfeed  Prenatal labs and studies: ABO, Rh: --/--/PENDING (07/04 1716) Antibody: PENDING (07/04 1716) Rubella: 1.05 (03/23 1124) RPR: Non Reactive (05/19 1135)  HBsAg: Negative (03/23 1124)  HIV: Non Reactive (05/19 1135)  GBS: Negative/-- (06/27 1044)  2hr Glucola: negative Genetic screening: normal Anatomy US: normal  Past Medical History:  Past Medical History:  Diagnosis Date   Asthma    as a child   COVID-19 virus infection 03/2020   Gestational diabetes    Morbid obesity (HCC)     Past Surgical History:  Past Surgical History:  Procedure Laterality Date   CESAREAN SECTION     CESAREAN SECTION N/A 09/01/2019   Procedure: CESAREAN SECTION;  Surgeon: Conan Bowens, MD;  Location: [facility] LD ORS;  Service: Obstetrics;  Laterality: N/A;   CHOLECYSTECTOMY      Obstetrical History:  OB  History     Gravida  3   Para  2   Term  2   Preterm      AB      Living  2      SAB      IAB      Ectopic      Multiple  0   Live Births  2           Gynecological History:  OB History     Gravida  3   Para  2   Term  2   Preterm      AB      Living  2      SAB      IAB      Ectopic      Multiple  0   Live Births  2           Social History:  Social History   Socioeconomic History   Marital status: Single    Spouse name: Not on file   Number of children: Not on file   Years of education: Not on file   Highest education level: Not on file  Occupational History   Not on file  Tobacco Use   Smoking status: Never   Smokeless tobacco: Never  Vaping Use   Vaping Use: Never used  Substance and Sexual Activity   Alcohol use: Never   Drug use: Never   Sexual activity: Not Currently    Partners: Male    Birth control/protection: None  Other Topics Concern  Not on file  Social History Narrative   Not on file   Social Determinants of Health   Financial Resource Strain: Not on file  Food Insecurity: Not on file  Transportation Needs: Not on file  Physical Activity: Not on file  Stress: Not on file  Social Connections: Not on file    Family History:  Family History  Problem Relation Age of Onset   Heart attack Mother    Rheum arthritis Father     Medications:  Prenatal vitamins,  Current Facility-Administered Medications  Medication Dose Route Frequency Provider Last Rate Last Admin   ceFAZolin (ANCEF) IVPB 2g/100 mL premix  2 g Intravenous On Call to OR Levie Heritage, DO       famotidine (PEPCID) IVPB 20 mg premix  20 mg Intravenous Once Trevor Iha, MD       lactated ringers infusion   Intravenous Continuous Levie Heritage, DO 125 mL/hr at 10/19/21 1722 New Bag at 10/19/21 1722   povidone-iodine 10 % swab 2 Application  2 Application Topical Once Noam Franzen J, DO       sodium citrate-citric acid  (ORACIT) solution 30 mL  30 mL Oral Once Trevor Iha, MD        Allergies: No Known Allergies  Review of Systems: -  negative  Physical Exam: Blood pressure 115/70, pulse 93, temperature 98.2 F (36.8 C), temperature source Oral, resp. rate 14, height 5\' 2"  (1.575 m), weight 116.4 kg, last menstrual period 02/04/2021, SpO2 100 %, unknown if currently breastfeeding. GENERAL: Well-developed, well-nourished female in no acute distress.  LUNGS: Clear to auscultation bilaterally.  HEART: Regular rate and rhythm. ABDOMEN: Soft, nontender, nondistended, gravid. EFW 8 lbs EXTREMITIES: Nontender, no edema, 2+ distal pulses. Cervical Exam: Dilation: 4 Effacement (%): 80 Station: -3 Exam by:: [email or web address], RN  FHT:  Baseline rate 140 bpm   Variability moderate  Accelerations absent   Decelerations none Contractions: Every 3 mins   Pertinent Labs/Studies:   Lab Results  Component Value Date   WBC 11.5 (H) 09/03/2021   HGB 10.5 (L) 09/03/2021   HCT 32.8 (L) 09/03/2021   MCV 80 09/03/2021   PLT 295 09/03/2021    Assessment : Ruth Perry is a 31 y.o. G3P2002 at [redacted]w[redacted]d being admitted for cesarean section secondary to 2 prior cesarean deliveries, labor.  Plan: The risks of cesarean section with tubal ligation discussed with the patient included but were not limited to: bleeding which may require transfusion or reoperation; infection which may require antibiotics; injury to bowel, bladder, ureters or other surrounding organs; injury to the fetus; need for additional procedures including hysterectomy in the event of a life-threatening hemorrhage; placental abnormalities wth subsequent pregnancies, incisional problems, thromboembolic phenomenon and other postoperative/anesthesia complications. The patient concurred with the proposed plan, giving informed written consent for the procedure.   Patient has been NPO since 1pm and will remain NPO for procedure.  Preoperative prophylactic Ancef  ordered on call to the OR.   Due to the patient being in labor, we should not wait for 6-8 hours NPO status and will proceed with cesarean section when labs return.  [redacted]w[redacted]d, DO 10/19/2021, 5:31 PM

## 2021-10-19 NOTE — Transfer of Care (Signed)
Immediate Anesthesia Transfer of Care Note  Patient: Ruth Perry  Procedure(s) Performed: CESAREAN SECTION (canceled)  Patient Location: PACU  Anesthesia Type:Spinal  Level of Consciousness: awake, alert  and patient cooperative  Airway & Oxygen Therapy: Patient Spontanous Breathing  Post-op Assessment: Report given to RN and Post -op Vital signs reviewed and stable  Post vital signs: Reviewed and stable  Last Vitals:  Vitals Value Taken Time  BP 100/64 10/19/21 2005  Temp    Pulse 64 10/19/21 2010  Resp 20 10/19/21 2010  SpO2 100 % 10/19/21 2010  Vitals shown include unvalidated device data.  Last Pain:  Vitals:   10/19/21 1704  TempSrc: Oral  PainSc:          Complications: No notable events documented.

## 2021-10-19 NOTE — Lactation Note (Signed)
This note was copied from a baby's chart. Lactation Consultation Note  Patient Name: Ruth Perry [id number] Date: 10/19/2021   Age:29 hours Per RN Vito Berger), mom has decided to formula feed only.  Maternal Data    Feeding Nipple Type: Slow - flow  LATCH Score                    Lactation Tools Discussed/Used    Interventions    Discharge    Consult Status      Danelle Earthly 10/19/2021, 11:56 PM

## 2021-10-20 ENCOUNTER — Encounter: Payer: Medicaid Other | Admitting: Medical

## 2021-10-20 ENCOUNTER — Encounter (HOSPITAL_COMMUNITY): Payer: Self-pay | Admitting: Obstetrics & Gynecology

## 2021-10-20 LAB — CBC
HCT: 35.4 % — ABNORMAL LOW (ref 36.0–46.0)
Hemoglobin: 11 g/dL — ABNORMAL LOW (ref 12.0–15.0)
MCH: 23.4 pg — ABNORMAL LOW (ref 26.0–34.0)
MCHC: 31.1 g/dL (ref 30.0–36.0)
MCV: 75.2 fL — ABNORMAL LOW (ref 80.0–100.0)
Platelets: 278 10*3/uL (ref 150–400)
RBC: 4.71 MIL/uL (ref 3.87–5.11)
RDW: 17.2 % — ABNORMAL HIGH (ref 11.5–15.5)
WBC: 17.6 10*3/uL — ABNORMAL HIGH (ref 4.0–10.5)
nRBC: 0 % (ref 0.0–0.2)

## 2021-10-20 LAB — RPR: RPR Ser Ql: NONREACTIVE

## 2021-10-20 LAB — GLUCOSE, CAPILLARY: Glucose-Capillary: 103 mg/dL — ABNORMAL HIGH (ref 70–99)

## 2021-10-20 NOTE — Progress Notes (Signed)
POSTPARTUM PROGRESS NOTE  POD #1  Subjective:  Ruth Perry is a 31 y.o. G3P3003 s/p rLTCS at [redacted]w[redacted]d. No acute events overnight. She reports she is doing well. She denies any problems with ambulating or po intake. Urinary catheter coming out later this am. Denies nausea or vomiting. She has passed flatus. Pain is well controlled.  Lochia is mild.  Objective: Blood pressure 134/65, pulse 68, temperature 97.9 F (36.6 C), temperature source Oral, resp. rate 18, height 5\' 2"  (1.575 m), weight 116.4 kg, last menstrual period 02/04/2021, SpO2 98 %, unknown if currently breastfeeding.  Physical Exam:  General: alert, cooperative and no distress Chest: no respiratory distress Heart: regular rate, distal pulses intact Uterine Fundus: firm, appropriately tender DVT Evaluation: No calf swelling or tenderness Extremities: minimal edema Skin: warm, dry; incision clean/dry/intact w/ honeycomb dressing in place  Recent Labs    10/19/21 1718 10/20/21 0516  HGB 11.0* 11.0*  HCT 34.0* 35.4*    Assessment/Plan: Ruth Perry is a 31 y.o. G3P3003 s/p rLTCS at [redacted]w[redacted]d.  POD#1 - Doing welll; pain well controlled.   Routine postpartum care  OOB, ambulated  Lovenox for VTE prophylaxis  Contraception: BTL done  Feeding: Breast  Dispo: Plan for discharge tomorrow or the following day pending clinical course.   LOS: 1 day   [redacted]w[redacted]d, DO OB Fellow  10/20/2021, 8:10 AM

## 2021-10-21 ENCOUNTER — Other Ambulatory Visit: Payer: Medicaid Other

## 2021-10-21 LAB — SURGICAL PATHOLOGY

## 2021-10-21 LAB — GLUCOSE, CAPILLARY: Glucose-Capillary: 71 mg/dL (ref 70–99)

## 2021-10-21 MED ORDER — OXYCODONE HCL 5 MG PO TABS: 5.0000 mg | ORAL_TABLET | ORAL | 0 refills | Status: AC | PRN

## 2021-10-21 MED ORDER — IBUPROFEN 600 MG PO TABS: 600.0000 mg | ORAL_TABLET | Freq: Four times a day (QID) | ORAL | 0 refills | Status: AC | PRN

## 2021-10-21 NOTE — Discharge Summary (Signed)
Postpartum Discharge Summary      Patient Name: Ruth Perry DOB: 1990/09/25 MRN: [id number]  Date of admission: 10/19/2021 Delivery date:10/19/2021  Delivering provider: Truett Mainland  Date of discharge: 10/21/2021  Admitting diagnosis: Intrauterine pregnancy [Z34.90] Intrauterine pregnancy: [redacted]w[redacted]d    Secondary diagnosis:  Principal Problem:   Intrauterine pregnancy Active Problems:   History of 2 cesarean sections   Late prenatal care complicating pregnancy in second trimester   Diet controlled gestational diabetes mellitus (GDM) in third trimester   Unwanted fertility  Additional problems: none    Discharge diagnosis: Term Pregnancy Delivered and GDM A1                                              Post partum procedures: none Augmentation: N/A Complications: None  Hospital course: Planned C/S & BTL   31y.o. yo G3P3003 at 371w2das admitted to the hospital 10/19/2021 for cesarean section performed a little earlier than planned due to the onset of labor with the following indication:Elective Repeat.Delivery details are as follows:  Membrane Rupture Time/Date: 7:11 PM ,10/19/2021   Delivery Method:C-Section, Low Transverse  Details of operation can be found in separate operative note; she also had a BTL.  Patient had an uncomplicated postpartum course.  Her Hgb on POD#1 was 11.0 and had also been 11.0 on admission; on POD#2 her fasting CBG was 71. She is ambulating, tolerating a regular diet, passing flatus, and urinating well. Patient is discharged home in stable condition on  10/21/21 per her request for early d/c as long as the baby can go.        Newborn Data: Birth date:10/19/2021  Birth time:7:12 PM  Gender:Female  Living status:Living  Apgars:9 ,9  Weight:3306 g (7lb 4.6oz)    Magnesium Sulfate received: No BMZ received: No Rhophylac:N/A MMR:N/A T-DaP:Given prenatally Flu: Yes Transfusion:No  Physical exam  Vitals:   10/20/21 0948 10/20/21 1500 10/20/21 2040  10/21/21 0536  BP: (!) 111/52 (!) 102/49 (!) 113/48 (!) 131/54  Pulse: 71 67 68 79  Resp: 20 18  18   Temp: 97.9 F (36.6 C) 98.3 F (36.8 C)    TempSrc: Oral Oral  Oral  SpO2: 99%   98%  Weight:      Height:       General: alert and cooperative Lochia: appropriate Uterine Fundus: firm Incision: honeycomb intact and dry DVT Evaluation: No evidence of DVT seen on physical exam. Labs: Lab Results  Component Value Date   WBC 17.6 (H) 10/20/2021   HGB 11.0 (L) 10/20/2021   HCT 35.4 (L) 10/20/2021   MCV 75.2 (L) 10/20/2021   PLT 278 10/20/2021      Latest Ref Rng & Units 09/02/2019    4:32 AM  CMP  Creatinine 0.44 - 1.00 mg/dL 0.63    Edinburgh Score:    09/03/2019   12:15 AM  Edinburgh Postnatal Depression Scale Screening Tool  I have been able to laugh and see the funny side of things. 0  I have looked forward with enjoyment to things. 0  I have blamed myself unnecessarily when things went wrong. 1  I have been anxious or worried for no good reason. 0  I have felt scared or panicky for no good reason. 2  Things have been getting on top of me. 2  I have been so unhappy  that I have had difficulty sleeping. 0  I have felt sad or miserable. 0  I have been so unhappy that I have been crying. 0  The thought of harming myself has occurred to me. 0  Edinburgh Postnatal Depression Scale Total 5     After visit meds:  Allergies as of 10/21/2021   No Known Allergies      Medication List     STOP taking these medications    Accu-Chek Guide w/Device Kit   Accu-Chek Softclix Lancets lancets   acetaminophen 325 MG tablet Commonly known as: TYLENOL   Blood Pressure Kit Chemical engineer Maternity Supp Sm Misc   Gojji Weight Scale Misc   TUMS PO       TAKE these medications    albuterol 108 (90 Base) MCG/ACT inhaler Commonly known as: VENTOLIN HFA Inhale 2 puffs into the lungs every 6 (six) hours as needed for wheezing or shortness of breath. What changed:  when to take this   ibuprofen 600 MG tablet Commonly known as: ADVIL Take 1 tablet (600 mg total) by mouth every 6 (six) hours as needed.   oxyCODONE 5 MG immediate release tablet Commonly known as: Oxy IR/ROXICODONE Take 1-2 tablets (5-10 mg total) by mouth every 4 (four) hours as needed for moderate pain.   PRENATAL PO Take 1 tablet by mouth daily.         Discharge home in stable condition Infant Feeding: Bottle Infant Disposition:home with mother Discharge instruction: per After Visit Summary and Postpartum booklet. Activity: Advance as tolerated. Pelvic rest for 6 weeks.  Diet: routine diet Future Appointments: Future Appointments  Date Time Provider [place]  10/22/2021  1:00 PM [facility] PAT 1 [facility] None   Follow up Visit:  Myrtis Ser, CNM  P [facility] Please schedule this patient for Postpartum visit in: 6 weeks with the following provider: Any provider  In-Person  For C/S patients schedule nurse incision check in weeks 1-2 weeks: yes  High risk pregnancy complicated by: [email or web address]  Delivery mode:  CS  Anticipated Birth Control:  BTL w C/S  PP Procedures needed: 2 hour GTT w 6wk visit  Schedule Integrated [place] visit: no   10/21/2021 Myrtis Ser, CNM 8:44 AM

## 2021-10-22 ENCOUNTER — Other Ambulatory Visit (HOSPITAL_COMMUNITY)
Admission: RE | Admit: 2021-10-22 | Discharge: 2021-10-22 | Disposition: A | Discharge status: Home / Self Care | Payer: Medicaid Other | Source: Ambulatory Visit

## 2021-10-22 HISTORY — DX: Gestational diabetes mellitus in pregnancy, unspecified control: O24.419

## 2021-10-27 ENCOUNTER — Encounter: Payer: Medicaid Other | Admitting: Obstetrics and Gynecology

## 2021-10-28 ENCOUNTER — Ambulatory Visit (INDEPENDENT_AMBULATORY_CARE_PROVIDER_SITE_OTHER): Payer: Medicaid Other

## 2021-10-28 DIAGNOSIS — Z5189 Encounter for other specified aftercare: Secondary | ICD-10-CM

## 2021-10-28 NOTE — Progress Notes (Signed)
Subjective:     Ruth Perry is a 31 y.o. female who presents to the clinic 1 weeks status post  LTCS  for  Incision check . Eating a regular diet without difficulty. Bowel movements are normal. Pain is controlled with current analgesics. Medications being used: acetaminophen.   Review of Systems Pertinent items are noted in HPI.    Objective:    BP 116/77   Pulse 71   Ht 5\' 3"  (1.6 m)   Wt 239 lb (108.4 kg)   LMP 02/04/2021 (Approximate)   Breastfeeding No   BMI 42.34 kg/m  General:  alert and no distress  Abdomen: soft, bowel sounds active, non-tender  Incision:   healing well, no drainage, no erythema, no hernia, no seroma, no swelling, no dehiscence, incision well approximated     Assessment:    Doing well postoperatively.   Plan:    1. Continue any current medications. 2. Wound care discussed. 3. Activity restrictions: none 4. Anticipated return to work: not applicable. 5. Follow up: 5 weeks for Postpartum Visit.   02/06/2021, RMA

## 2021-10-28 NOTE — Progress Notes (Signed)
Patient was assessed and managed by nursing staff during this encounter. I have reviewed the chart and agree with the documentation and plan. I have also made any necessary editorial changes.  Kaleb Linquist A Thayne Cindric, MD 10/28/2021 2:37 PM   

## 2021-11-03 ENCOUNTER — Encounter: Payer: Medicaid Other | Admitting: Medical

## 2021-12-02 ENCOUNTER — Ambulatory Visit: Payer: Medicaid Other | Admitting: Obstetrics and Gynecology

## 2021-12-02 ENCOUNTER — Other Ambulatory Visit: Payer: Medicaid Other

## 2022-04-04 ENCOUNTER — Emergency Department (HOSPITAL_COMMUNITY)
Admission: EM | Admit: 2022-04-04 | Discharge: 2022-04-05 | Discharge status: Against Medical Advice | Payer: Medicaid Other

## 2022-04-04 NOTE — ED Notes (Addendum)
Pt called for triage twice w/o response

## 2022-04-04 NOTE — ED Notes (Signed)
Pt called for triage no response ?
